# Patient Record
Sex: Female | Born: 1965 | Race: White | Hispanic: No | Smoking: Current every day smoker
Health system: Southern US, Community
[De-identification: ages and names within clinical notes are randomized; demographics above are authoritative.]

## PROBLEM LIST (undated history)

## (undated) DIAGNOSIS — E079 Disorder of thyroid, unspecified: Secondary | ICD-10-CM

## (undated) DIAGNOSIS — I10 Essential (primary) hypertension: Secondary | ICD-10-CM

## (undated) HISTORY — PX: TONSILLECTOMY: SUR1361

## (undated) HISTORY — PX: ELBOW SURGERY: SHX618

---

## 2001-09-06 ENCOUNTER — Other Ambulatory Visit: Admission: RE | Admit: 2001-09-06 | Discharge: 2001-09-06 | Payer: Self-pay | Admitting: Obstetrics and Gynecology

## 2004-01-17 ENCOUNTER — Ambulatory Visit (HOSPITAL_BASED_OUTPATIENT_CLINIC_OR_DEPARTMENT_OTHER): Admission: RE | Admit: 2004-01-17 | Discharge: 2004-01-17 | Payer: Self-pay | Admitting: Orthopedic Surgery

## 2004-01-17 ENCOUNTER — Ambulatory Visit (HOSPITAL_COMMUNITY): Admission: RE | Admit: 2004-01-17 | Discharge: 2004-01-17 | Payer: Self-pay | Admitting: Orthopedic Surgery

## 2013-04-04 ENCOUNTER — Ambulatory Visit: Payer: BC Managed Care – PPO

## 2013-04-04 ENCOUNTER — Ambulatory Visit (INDEPENDENT_AMBULATORY_CARE_PROVIDER_SITE_OTHER): Payer: BC Managed Care – PPO | Admitting: Emergency Medicine

## 2013-04-04 VITALS — BP 126/82 | HR 63 | Temp 98.0°F | Resp 16 | Ht 60.0 in | Wt 128.0 lb

## 2013-04-04 DIAGNOSIS — M549 Dorsalgia, unspecified: Secondary | ICD-10-CM

## 2013-04-04 DIAGNOSIS — M25559 Pain in unspecified hip: Secondary | ICD-10-CM

## 2013-04-04 DIAGNOSIS — S139XXA Sprain of joints and ligaments of unspecified parts of neck, initial encounter: Secondary | ICD-10-CM

## 2013-04-04 DIAGNOSIS — S20229A Contusion of unspecified back wall of thorax, initial encounter: Secondary | ICD-10-CM

## 2013-04-04 DIAGNOSIS — M25529 Pain in unspecified elbow: Secondary | ICD-10-CM

## 2013-04-04 DIAGNOSIS — Z23 Encounter for immunization: Secondary | ICD-10-CM

## 2013-04-04 DIAGNOSIS — S50319A Abrasion of unspecified elbow, initial encounter: Secondary | ICD-10-CM

## 2013-04-04 DIAGNOSIS — IMO0002 Reserved for concepts with insufficient information to code with codable children: Secondary | ICD-10-CM

## 2013-04-04 LAB — POCT UA - MICROSCOPIC ONLY
Crystals, Ur, HPF, POC: NEGATIVE
Epithelial cells, urine per micros: NEGATIVE
Mucus, UA: NEGATIVE
RBC, urine, microscopic: NEGATIVE
WBC, Ur, HPF, POC: NEGATIVE
Yeast, UA: NEGATIVE

## 2013-04-04 LAB — POCT URINALYSIS DIPSTICK
Blood, UA: NEGATIVE
Ketones, UA: NEGATIVE
Leukocytes, UA: NEGATIVE
pH, UA: 7.5

## 2013-04-04 MED ORDER — CYCLOBENZAPRINE HCL 10 MG PO TABS: 10.0000 mg | ORAL_TABLET | Freq: Three times a day (TID) | ORAL | Status: DC | PRN

## 2013-04-04 MED ORDER — NAPROXEN SODIUM 550 MG PO TABS: 550.0000 mg | ORAL_TABLET | Freq: Two times a day (BID) | ORAL | Status: AC

## 2013-04-04 NOTE — Patient Instructions (Addendum)

## 2013-04-04 NOTE — Progress Notes (Signed)
[facility] ([facility]) [address] [place] [address] [phone]  Date:  04/04/2013   Name:  Carol Ho   DOB:  06-02-1966   MRN:  [id number]  PCP:  No primary provider on file.    Chief Complaint: Head Injury and Elbow Injury   History of Present Illness:  Carol Ho is a 47 y.o. very pleasant female patient who presents with the following:  Working on a ladder trimming vines.  She fell off the ladder on to a bricked area.  She fell flat on her back and injured her elbows, low back and neck, her right "hip" and struck her head.  She had no LOC, neuro or visual symptoms.  No nausea or vomiting.  No blood in urine.  No chest pain or shortness of breath.  No peripheral neuro symptoms.  No improvement with over the counter medications or other home remedies. Denies other complaint or health concern today.   There are no active problems to display for this patient.   No past medical history on file.  No past surgical history on file.  History  Substance Use Topics  . Smoking status: Not on file  . Smokeless tobacco: Not on file  . Alcohol Use: Not on file    No family history on file.  Allergies  Allergen Reactions  . Darvon [Propoxyphene Hcl] Hives    Medication list has been reviewed and updated.  No current outpatient prescriptions on file prior to visit.   No current facility-administered medications on file prior to visit.    Review of Systems:  As per HPI, otherwise negative.    Physical Examination: Filed Vitals:   04/04/13 1135  BP: 126/82  Pulse: 63  Temp: 98 F (36.7 C)  Resp: 16   Filed Vitals:   04/04/13 1135  Height: 5' (1.524 m)  Weight: 128 lb (58.06 kg)   Body mass index is 25 kg/(m^2). Ideal Body Weight: Weight in (lb) to have BMI = 25: 127.7  As per HPI, otherwise negative.    Assessment and Plan: GEN: WDWN, NAD, Non-toxic, A & O x 3 HEENT: contusion occiput, Normocephalic. Neck supple. No masses, No  LAD. Ears and Nose: No external deformity.  PRRERLA EOMI, CN 2-12 intact.  No battle sign.   NECK tender base of spine. CV: RRR, No M/G/R. No JVD. No thrill. No extra heart sounds. PULM: CTA B, no wheezes, crackles, rhonchi. No retractions. No resp. distress. No accessory muscle use. ABD: S, NT, ND, +BS. No rebound. No HSM. Back:  Tender lumbar spine inferiorly.  No ecchymosis, deformity or step off.   PELVIS:  Tender posterior pelvis on right.  EXTR: No c/c/e  Abrasions on both elbows  Full ROM NEURO Normal gait.  PSYCH: Normally interactive. Conversant. Not depressed or anxious appearing.  Calm demeanor.   Signed,  Phillips Odor, MD   [facility] reading (PRIMARY) by  Dr. Dareen Piano.  R elbow. negative.  [facility] reading (PRIMARY) by  Dr. Dareen Piano.  L elbow.  negative.  [facility] reading (PRIMARY) by  Dr. Dareen Piano.  Cspine.  Loss lordotic curve.  [facility] reading (PRIMARY) by  Dr. Dareen Piano.  LSPINE  negative1.  [facility] reading (PRIMARY) by  Dr. Dareen Piano.  Pelvis ??chip off acetabular rim.  Results for orders placed in visit on 04/04/13  POCT UA - MICROSCOPIC ONLY      Result Value Range   WBC, Ur, HPF, POC neg     RBC, urine, microscopic neg  Bacteria, U Microscopic neg     Mucus, UA neg     Epithelial cells, urine per micros neg     Crystals, Ur, HPF, POC neg     Casts, Ur, LPF, POC neg     Yeast, UA neg    POCT URINALYSIS DIPSTICK      Result Value Range   Color, UA yellow     Clarity, UA clear     Glucose, UA neg     Bilirubin, UA neg     Ketones, UA neg     Spec Grav, UA 1.010     Blood, UA neg     pH, UA 7.5     Protein, UA trace     Urobilinogen, UA 0.2     Nitrite, UA neg     Leukocytes, UA Negative

## 2013-04-05 ENCOUNTER — Telehealth: Payer: Self-pay

## 2013-04-05 NOTE — Telephone Encounter (Signed)
[phone] patient says there are some thing that were left off her paperwork from yesterday that needs to be on there for insurance purposes

## 2013-04-06 ENCOUNTER — Telehealth: Payer: Self-pay | Admitting: Radiology

## 2013-04-06 NOTE — Telephone Encounter (Signed)
What is she referring to? She indicates she needs something stating she got a wrist brace and crutches. She was not charged for these, and there is nothing in the chart indicating she had these, will you advise if she received these here, if so we need to correct these problems.

## 2013-04-06 NOTE — Telephone Encounter (Signed)
She would not be provided with crutches AND a wrist splint.  Ask her specifically what she is referring to .....Marland KitchenMarland Kitchen

## 2013-04-06 NOTE — Telephone Encounter (Signed)
See previous note (telephone encounter)

## 2013-04-06 NOTE — Telephone Encounter (Signed)
She states you gave her the crutches and wrist splint in the office, she came in today, and was wearing the wrist splint and using the crutches, did you give these to her here?

## 2013-04-07 NOTE — Telephone Encounter (Signed)
Must have.

## 2013-04-10 NOTE — Telephone Encounter (Signed)
Can we bill for these?

## 2015-05-28 IMAGING — CR DG ELBOW COMPLETE 3+V*R*
4 series · 4 of 4 positions shown · non-contrast
Comparison: None

CLINICAL DATA: Lateral elbow pain, initial encounter

EXAM:
RIGHT ELBOW - COMPLETE 3+ VIEW

[lateral (1 of 2)]
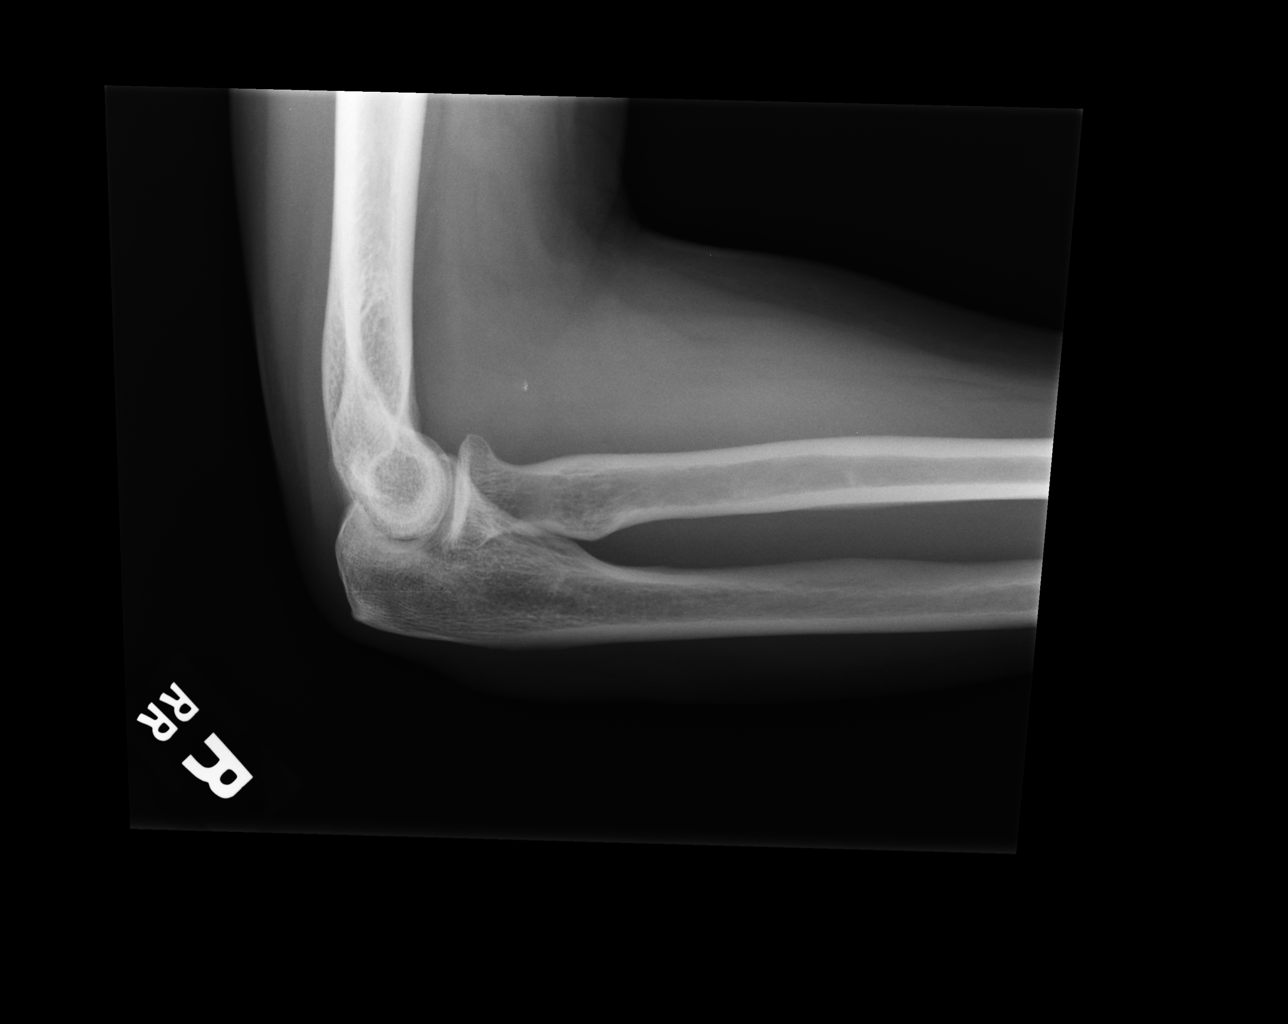

[lateral (2 of 2)]
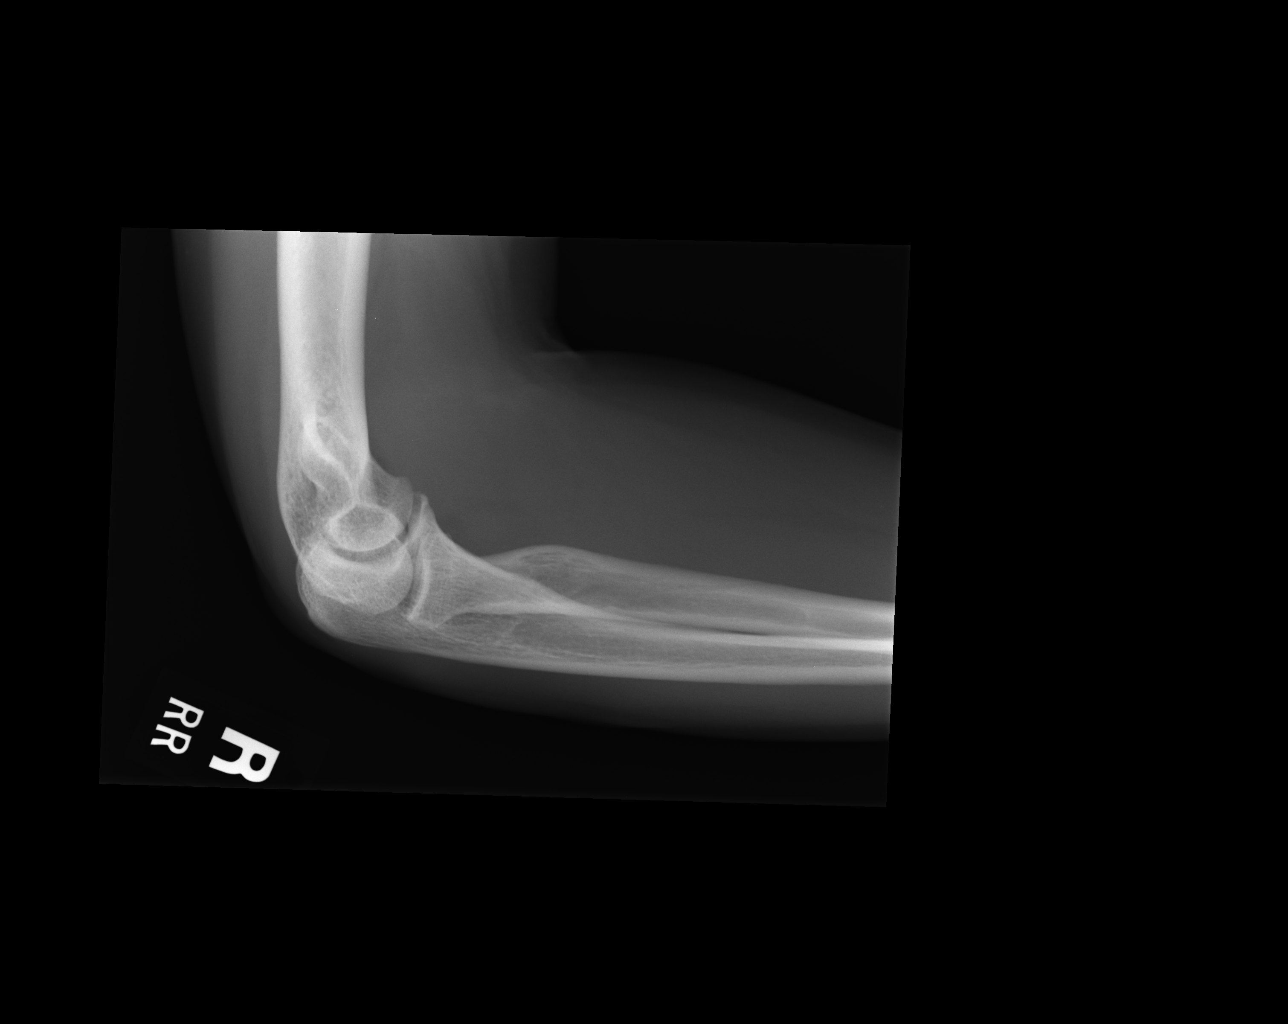

[ap obl ext rot]
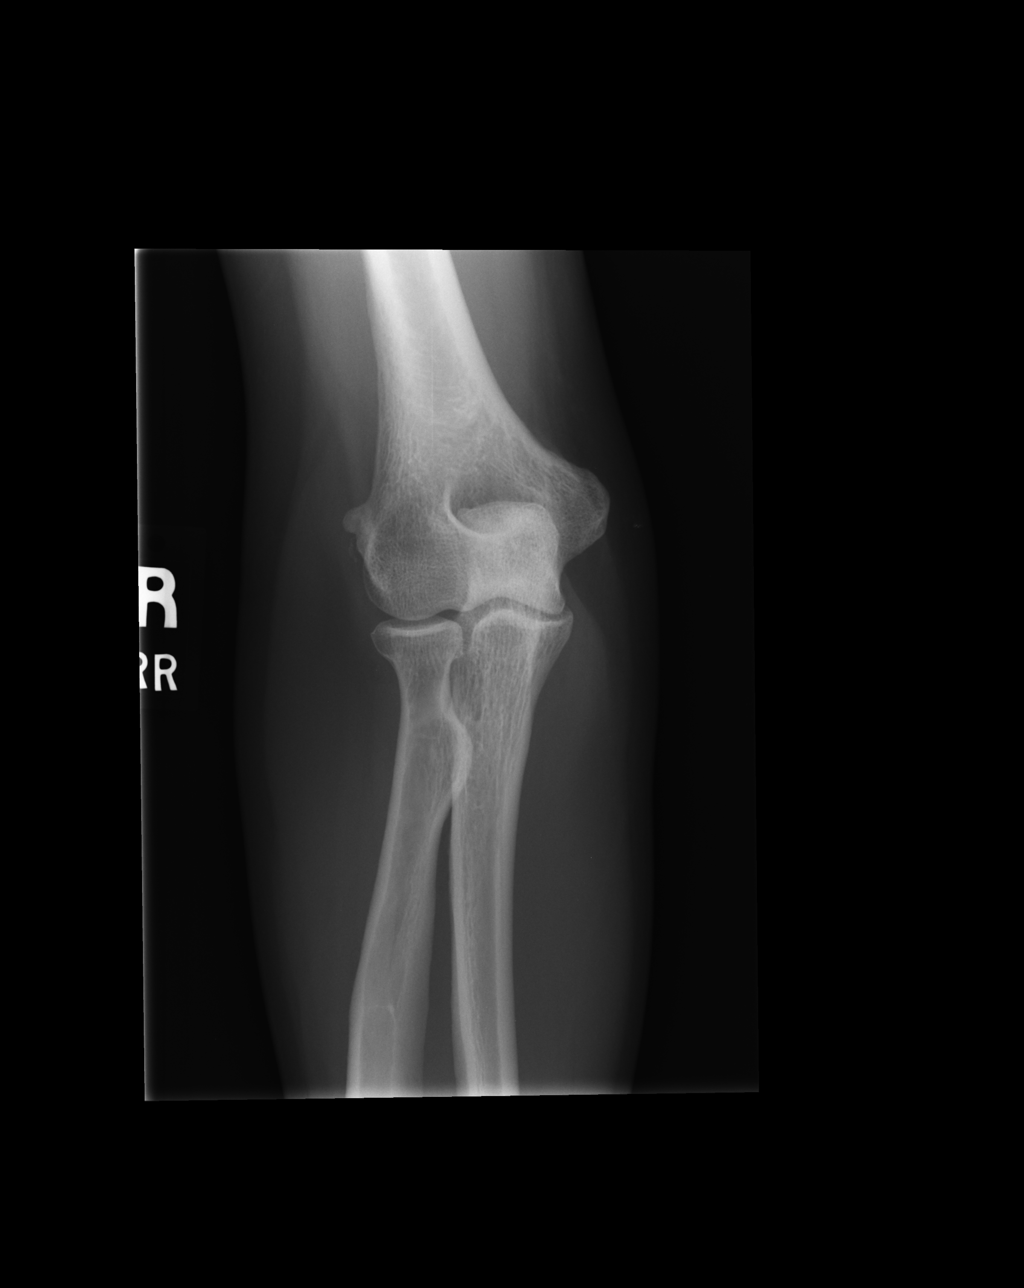

[ap obl int rot]
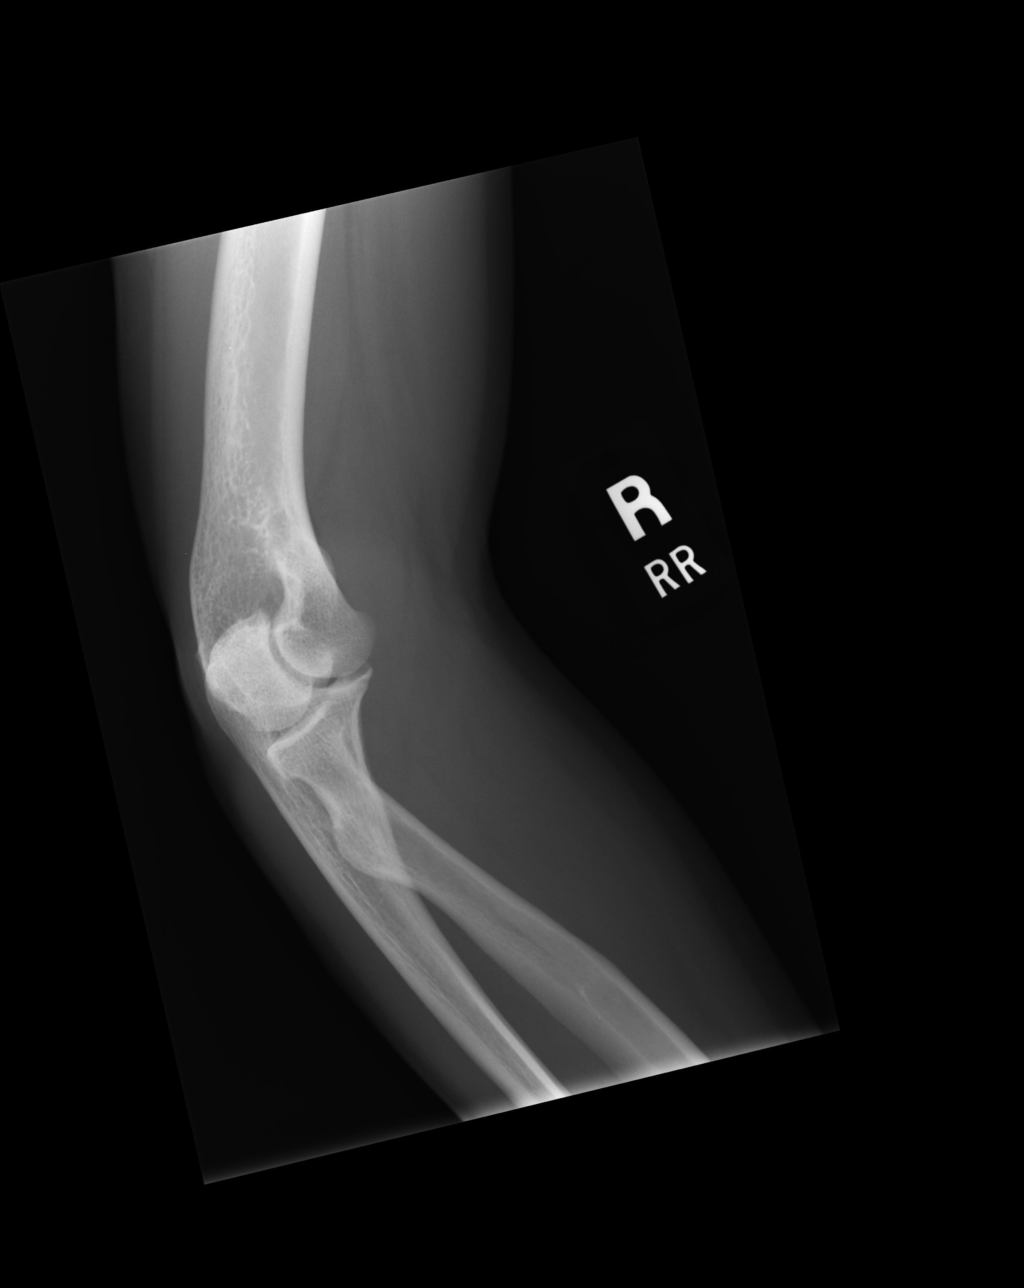

[4 of 4 positions shown; findings below may reference images not displayed]

FINDINGS: Bone mineralization normal.

Joint spaces preserved.

No joint effusion.

Calcific densities are seen at the lateral margin of the distal
humeral epicondyle, could be related to avulsion injury or chronic
lateral epicondylitis.
IMPRESSION: Calcific densities adjacent to the lateral margin of the lateral
humeral epicondyle, question due to avulsion injury or chronic
lateral epicondylitis.

## 2015-06-06 LAB — RESULTS CONSOLE HPV: CHL HPV: NEGATIVE

## 2015-06-06 LAB — HM PAP SMEAR: HM Pap smear: NORMAL

## 2015-06-06 LAB — HM MAMMOGRAPHY

## 2015-07-03 DIAGNOSIS — I10 Essential (primary) hypertension: Secondary | ICD-10-CM | POA: Insufficient documentation

## 2015-07-03 DIAGNOSIS — R251 Tremor, unspecified: Secondary | ICD-10-CM | POA: Insufficient documentation

## 2015-07-03 DIAGNOSIS — E039 Hypothyroidism, unspecified: Secondary | ICD-10-CM | POA: Insufficient documentation

## 2015-07-03 DIAGNOSIS — F411 Generalized anxiety disorder: Secondary | ICD-10-CM | POA: Insufficient documentation

## 2015-07-03 DIAGNOSIS — Z79899 Other long term (current) drug therapy: Secondary | ICD-10-CM | POA: Insufficient documentation

## 2015-07-03 DIAGNOSIS — M778 Other enthesopathies, not elsewhere classified: Secondary | ICD-10-CM | POA: Insufficient documentation

## 2015-07-03 DIAGNOSIS — R29898 Other symptoms and signs involving the musculoskeletal system: Secondary | ICD-10-CM | POA: Insufficient documentation

## 2015-07-03 DIAGNOSIS — L819 Disorder of pigmentation, unspecified: Secondary | ICD-10-CM | POA: Insufficient documentation

## 2015-07-03 DIAGNOSIS — M542 Cervicalgia: Secondary | ICD-10-CM | POA: Insufficient documentation

## 2015-07-03 DIAGNOSIS — Z789 Other specified health status: Secondary | ICD-10-CM | POA: Insufficient documentation

## 2021-01-05 DIAGNOSIS — M7551 Bursitis of right shoulder: Secondary | ICD-10-CM | POA: Insufficient documentation

## 2021-04-30 ENCOUNTER — Other Ambulatory Visit: Payer: Self-pay

## 2021-04-30 ENCOUNTER — Encounter: Payer: Self-pay | Admitting: Emergency Medicine

## 2021-04-30 ENCOUNTER — Ambulatory Visit
Admission: EM | Admit: 2021-04-30 | Discharge: 2021-04-30 | Disposition: A | Discharge status: Home / Self Care | Payer: 59 | Attending: Internal Medicine | Admitting: Internal Medicine

## 2021-04-30 DIAGNOSIS — M545 Low back pain, unspecified: Secondary | ICD-10-CM | POA: Diagnosis not present

## 2021-04-30 LAB — POCT URINALYSIS DIP (MANUAL ENTRY)
Bilirubin, UA: NEGATIVE
Glucose, UA: NEGATIVE mg/dL
Ketones, POC UA: NEGATIVE mg/dL
Nitrite, UA: NEGATIVE
Protein Ur, POC: NEGATIVE mg/dL
Spec Grav, UA: 1.01 (ref 1.010–1.025)
Urobilinogen, UA: 0.2 E.U./dL
pH, UA: 7 (ref 5.0–8.0)

## 2021-04-30 MED ORDER — CYCLOBENZAPRINE HCL 5 MG PO TABS: 5.0000 mg | ORAL_TABLET | Freq: Two times a day (BID) | ORAL | 0 refills | Status: DC | PRN

## 2021-04-30 MED ORDER — IBUPROFEN 600 MG PO TABS: 600.0000 mg | ORAL_TABLET | Freq: Four times a day (QID) | ORAL | 0 refills | Status: DC | PRN

## 2021-04-30 NOTE — Discharge Instructions (Signed)
You have been sent two medications to help alleviate pain which are ibuprofen and cyclobenzaprine which is a muscle relaxer.  Please be advised that muscle relaxer can cause drowsiness.  Your urine culture is pending.  We will call if it is positive.

## 2021-04-30 NOTE — ED Triage Notes (Addendum)
Intermittent right flank pain/lower back pain over last couple weeks. Woke up this morning with severe pain to the point of having to hold nearby items to walk. Denies urinary frequency, dysuria, hematuria, previous appendectomy, nausea, vomiting, diarrhea, fever. Post menopausal.   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 Pain relieved when pressure is applied to lower back.

## 2021-04-30 NOTE — ED Provider Notes (Signed)
[facility]    CSN: [id number] Arrival date & time: 04/30/21  1805      History   Chief Complaint Chief Complaint  Patient presents with   Flank Pain    HPI Carol Ho is a 55 y.o. female.   Patient presents with right lower back pain that has been present intermittently for the past couple weeks but has worsened since this morning.  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  Denies any history of chronic back pain.  Patient denies any urinary frequency, urinary burning, hematuria, saddle anesthesia, urinary or bowel incontinence.  Patient has not taken any medications to help alleviate pain.  Movement exacerbates pain.  Pain does not radiate.  Denies nausea, vomiting, diarrhea and is having normal bowel movements.   Flank Pain   History reviewed. No pertinent past medical history.  There are no problems to display for this patient.   Past Surgical History:  Procedure Laterality Date   ELBOW SURGERY     TONSILLECTOMY      OB History   No obstetric history on file.      Home Medications    Prior to Admission medications   Medication Sig Start Date End Date Taking? Authorizing Provider  cyclobenzaprine (FLEXERIL) 5 MG tablet Take 1 tablet (5 mg total) by mouth 2 (two) times daily as needed for muscle spasms. 04/30/21  Yes Lynnie Koehler, Rolly Salter E, FNP  ibuprofen (ADVIL) 600 MG tablet Take 1 tablet (600 mg total) by mouth every 6 (six) hours as needed for mild pain. 04/30/21  Yes Sharlotte Baka, Rolly Salter E, FNP  hydrochlorothiazide (HYDRODIURIL) 25 MG tablet Take 25 mg by mouth daily.    [provider]  levothyroxine (SYNTHROID, LEVOTHROID) 100 MCG tablet Take 100 mcg by mouth daily before breakfast.    [provider]    Family History History reviewed. No pertinent family history.  Social History Social History   Tobacco Use   Smoking status: Every Day    Types: Cigarettes    Smokeless tobacco: Never     Allergies   Darvon [propoxyphene hcl]   Review of Systems Review of Systems Per HPI  Physical Exam Triage Vital Signs ED Triage Vitals  Enc Vitals Group     BP 04/30/21 1822 (!) 165/91     Pulse Rate 04/30/21 1822 75     Resp 04/30/21 1822 16     Temp 04/30/21 1822 98.9 F (37.2 C)     Temp Source 04/30/21 1822 Oral     SpO2 04/30/21 1822 99 %     Weight --      Height --      Head Circumference --      Peak Flow --      Pain Score 04/30/21 1823 7     Pain Loc --      Pain Edu? --      Excl. in GC? --    No data found.  Updated Vital Signs BP (!) 165/91 (BP Location: Right Arm)   Pulse 75   Temp 98.9 F (37.2 C) (Oral)   Resp 16   SpO2 99%   Visual Acuity Right Eye Distance:   Left Eye Distance:   Bilateral Distance:    Right Eye Near:   Left Eye Near:    Bilateral Near:     Physical Exam Constitutional:      General: She is not in acute distress.  Appearance: Normal appearance. She is not toxic-appearing or diaphoretic.  HENT:     Head: Normocephalic and atraumatic.  Eyes:     Extraocular Movements: Extraocular movements intact.     Conjunctiva/sclera: Conjunctivae normal.  Cardiovascular:     Rate and Rhythm: Normal rate and regular rhythm.     Pulses: Normal pulses.     Heart sounds: Normal heart sounds.  Pulmonary:     Effort: Pulmonary effort is normal. No respiratory distress.     Breath sounds: Normal breath sounds.  Abdominal:     General: Bowel sounds are normal. There is no distension.     Palpations: Abdomen is soft.     Tenderness: There is no abdominal tenderness.  Musculoskeletal:     Cervical back: Normal.     Thoracic back: Normal.     Lumbar back: Tenderness present. No swelling, edema or bony tenderness. Normal range of motion.       Back:     Comments: Tenderness to palpation to area on diagram.  No step-off or direct spinal tenderness.  Skin:    General: Skin is warm and dry.   Neurological:     General: No focal deficit present.     Mental Status: She is alert and oriented to person, place, and time. Mental status is at baseline.     Deep Tendon Reflexes: Reflexes are normal and symmetric.  Psychiatric:        Mood and Affect: Mood normal.        Behavior: Behavior normal.        Thought Content: Thought content normal.        Judgment: Judgment normal.     UC Treatments / Results  Labs (all labs ordered are listed, but only abnormal results are displayed) Labs Reviewed  POCT URINALYSIS DIP (MANUAL ENTRY) - Abnormal; Notable for the following components:      Result Value   Blood, UA trace-intact (*)    Leukocytes, UA Trace (*)    All other components within normal limits  URINE CULTURE    EKG   Radiology No results found.  Procedures Procedures (including critical care time)  Medications Ordered in UC Medications - No data to display  Initial Impression / Assessment and Plan / UC Course  I have reviewed the triage vital signs and the nursing notes.  Pertinent labs & imaging results that were available during my care of the patient were reviewed by me and considered in my medical decision making (see chart for details).     Differential diagnoses include back strain, kidney stone, urinary tract infection.  Urinalysis was not definitive for urinary tract infection.  Urine culture is pending.  Urinalysis does show small amount of red blood cells which could be indicative of kidney stone, although there is low suspicion.  Advised patient that it is possible.  Physical exam is most consistent with musculoskeletal pain.  Will treat with muscle relaxer and NSAIDs.  Patient to alternate ice and heat to affected area as well.  Advised patient that muscle relaxer can cause drowsiness.  No red flags seen on exam.  Patient advised to go to the hospital if pain significantly worsens.  Discussed strict return precautions.  Patient was agreeable plan and  voiced understanding. Final Clinical Impressions(s) / UC Diagnoses   Final diagnoses:  Acute right-sided low back pain without sciatica     Discharge Instructions      You have been sent two medications to help alleviate pain  which are ibuprofen and cyclobenzaprine which is a muscle relaxer.  Please be advised that muscle relaxer can cause drowsiness.  Your urine culture is pending.  We will call if it is positive.     ED Prescriptions     Medication Sig Dispense Auth. Provider   cyclobenzaprine (FLEXERIL) 5 MG tablet Take 1 tablet (5 mg total) by mouth 2 (two) times daily as needed for muscle spasms. 15 tablet [place], [place], [place]   ibuprofen (ADVIL) 600 MG tablet Take 1 tablet (600 mg total) by mouth every 6 (six) hours as needed for mild pain. 30 tablet [place], [place], [place]      I have reviewed the PDMP during this encounter.   Gustavus Bryant, [place] 04/30/21 1904

## 2021-05-03 LAB — URINE CULTURE: Culture: >=100000 — AB

## 2021-05-04 ENCOUNTER — Telehealth (HOSPITAL_COMMUNITY): Payer: Self-pay | Admitting: Emergency Medicine

## 2021-05-04 MED ORDER — NITROFURANTOIN MONOHYD MACRO 100 MG PO CAPS: 100.0000 mg | ORAL_CAPSULE | Freq: Two times a day (BID) | ORAL | 0 refills | Status: DC

## 2021-07-29 ENCOUNTER — Ambulatory Visit: Payer: Self-pay | Admitting: Orthopedic Surgery

## 2021-08-07 ENCOUNTER — Ambulatory Visit: Payer: Self-pay | Admitting: Orthopedic Surgery

## 2021-08-11 ENCOUNTER — Ambulatory Visit
Admission: EM | Admit: 2021-08-11 | Discharge: 2021-08-11 | Disposition: A | Discharge status: Other Acute Inpatient Hospital | Payer: Self-pay | Attending: Internal Medicine | Admitting: Internal Medicine

## 2021-08-11 ENCOUNTER — Emergency Department (HOSPITAL_BASED_OUTPATIENT_CLINIC_OR_DEPARTMENT_OTHER)
Admission: EM | Admit: 2021-08-11 | Discharge: 2021-08-11 | Disposition: A | Discharge status: Home / Self Care | Payer: Self-pay | Attending: Emergency Medicine | Admitting: Emergency Medicine

## 2021-08-11 ENCOUNTER — Encounter (HOSPITAL_BASED_OUTPATIENT_CLINIC_OR_DEPARTMENT_OTHER): Payer: Self-pay | Admitting: *Deleted

## 2021-08-11 ENCOUNTER — Encounter: Payer: Self-pay | Admitting: Emergency Medicine

## 2021-08-11 ENCOUNTER — Other Ambulatory Visit: Payer: Self-pay

## 2021-08-11 DIAGNOSIS — H5509 Other forms of nystagmus: Secondary | ICD-10-CM | POA: Insufficient documentation

## 2021-08-11 DIAGNOSIS — I160 Hypertensive urgency: Secondary | ICD-10-CM

## 2021-08-11 DIAGNOSIS — R42 Dizziness and giddiness: Secondary | ICD-10-CM | POA: Insufficient documentation

## 2021-08-11 DIAGNOSIS — E876 Hypokalemia: Secondary | ICD-10-CM | POA: Insufficient documentation

## 2021-08-11 HISTORY — DX: Essential (primary) hypertension: I10

## 2021-08-11 HISTORY — DX: Disorder of thyroid, unspecified: E07.9

## 2021-08-11 LAB — CBC WITH DIFFERENTIAL/PLATELET
Abs Immature Granulocytes: 0.02 10*3/uL (ref 0.00–0.07)
Basophils Absolute: 0.1 10*3/uL (ref 0.0–0.1)
Basophils Relative: 1 %
Eosinophils Absolute: 0.2 10*3/uL (ref 0.0–0.5)
Eosinophils Relative: 3 %
HCT: 44.9 % (ref 36.0–46.0)
Hemoglobin: 15.9 g/dL — ABNORMAL HIGH (ref 12.0–15.0)
Immature Granulocytes: 0 %
Lymphocytes Relative: 41 %
Lymphs Abs: 3.1 10*3/uL (ref 0.7–4.0)
MCH: 34.5 pg — ABNORMAL HIGH (ref 26.0–34.0)
MCHC: 35.4 g/dL (ref 30.0–36.0)
MCV: 97.4 fL (ref 80.0–100.0)
Monocytes Absolute: 0.4 10*3/uL (ref 0.1–1.0)
Monocytes Relative: 6 %
Neutro Abs: 3.7 10*3/uL (ref 1.7–7.7)
Neutrophils Relative %: 49 %
Platelets: 293 10*3/uL (ref 150–400)
RBC: 4.61 MIL/uL (ref 3.87–5.11)
RDW: 12.5 % (ref 11.5–15.5)
WBC: 7.5 10*3/uL (ref 4.0–10.5)
nRBC: 0 % (ref 0.0–0.2)

## 2021-08-11 LAB — BASIC METABOLIC PANEL
Anion gap: 11 (ref 5–15)
BUN: 14 mg/dL (ref 6–20)
CO2: 27 mmol/L (ref 22–32)
Calcium: 9.4 mg/dL (ref 8.9–10.3)
Chloride: 99 mmol/L (ref 98–111)
Creatinine, Ser: 0.7 mg/dL (ref 0.44–1.00)
GFR, Estimated: >60 mL/min (ref >60)
Glucose, Bld: 88 mg/dL (ref 70–99)
Potassium: 3.1 mmol/L — ABNORMAL LOW (ref 3.5–5.1)
Sodium: 137 mmol/L (ref 135–145)

## 2021-08-11 LAB — URINALYSIS, ROUTINE W REFLEX MICROSCOPIC
Bilirubin Urine: NEGATIVE
Glucose, UA: NEGATIVE mg/dL
Hgb urine dipstick: NEGATIVE
Ketones, ur: NEGATIVE mg/dL
Leukocytes,Ua: NEGATIVE
Nitrite: NEGATIVE
Protein, ur: NEGATIVE mg/dL
Specific Gravity, Urine: 1.015 (ref 1.005–1.030)
pH: 5.5 (ref 5.0–8.0)

## 2021-08-11 MED ORDER — POTASSIUM CHLORIDE CRYS ER 20 MEQ PO TBCR
40.0000 meq | EXTENDED_RELEASE_TABLET | Freq: Once | ORAL | Status: AC
  Administered 2021-08-11: 40 meq via ORAL
  Filled 2021-08-11: qty 2

## 2021-08-11 MED ORDER — SODIUM CHLORIDE 0.9 % IV BOLUS
500.0000 mL | Freq: Once | INTRAVENOUS | Status: AC
  Administered 2021-08-11: 500 mL via INTRAVENOUS

## 2021-08-11 NOTE — Discharge Instructions (Signed)
Please go to the emergency department as soon as you leave urgent care for further evaluation and management. ?

## 2021-08-11 NOTE — Discharge Instructions (Signed)
You were seen in the emergency department for dizziness and elevated blood pressure.  You had lab work and EKG that did not show any significant findings other than your potassium was low.  Please continue your regular medications and follow-up with your primary care doctor.  Low-salt diet.  Return to the emergency department if any worsening or concerning symptoms.

## 2021-08-11 NOTE — ED Provider Notes (Signed)
[facility] EMERGENCY DEPARTMENT Provider Note   CSN: [id number] Arrival date & time: 08/11/21  1657     History  Chief Complaint  Patient presents with   Dizziness    Carol Ho is a 56 y.o. female.  She said she is felt lightheaded for a couple of days.  Today was squatting down to do something at work and when she stood up she got very dizzy and unsteady.  It is resolved.  She checked her blood pressure and found it to be high.  She normally is on hydrochlorothiazide for blood pressure and its usually been steady.  No headache blurry vision double vision nausea vomiting chest pain shortness of breath.  She went to urgent care and found her blood pressure to be high.  They referred her here for further evaluation.  She said she is feeling better and wondering when she can be discharged because she needs to go get something to eat.  No numbness or weakness.  No unsteady gait.  The history is provided by the patient.  Dizziness Quality:  Lightheadedness and room spinning Severity:  Moderate Onset quality:  Sudden Duration: Seconds. Progression:  Resolved Chronicity:  New Context: bending over and standing up   Relieved by:  None tried Worsened by:  Nothing Ineffective treatments:  None tried Associated symptoms: no chest pain, no diarrhea, no headaches, no nausea, no palpitations, no shortness of breath, no syncope, no vision changes, no vomiting and no weakness   Risk factors: no new medications       Home Medications Prior to Admission medications   Medication Sig Start Date End Date Taking? Authorizing Provider  hydrochlorothiazide (HYDRODIURIL) 25 MG tablet Take 25 mg by mouth daily.   Yes [provider]  levothyroxine (SYNTHROID, LEVOTHROID) 100 MCG tablet Take 100 mcg by mouth daily before breakfast.   Yes [provider]  cyclobenzaprine (FLEXERIL) 5 MG tablet Take 1 tablet (5 mg total) by mouth 2 (two) times daily as needed for muscle  spasms. 04/30/21   Gustavus Bryant, FNP  ibuprofen (ADVIL) 600 MG tablet Take 1 tablet (600 mg total) by mouth every 6 (six) hours as needed for mild pain. 04/30/21   Gustavus Bryant, FNP  nitrofurantoin, macrocrystal-monohydrate, (MACROBID) 100 MG capsule Take 1 capsule (100 mg total) by mouth 2 (two) times daily. 05/04/21   Lamptey, Britta Mccreedy, MD      Allergies    Darvon [propoxyphene hcl]    Review of Systems   Review of Systems  Constitutional:  Negative for fever.  HENT:  Negative for sore throat.   Eyes:  Negative for visual disturbance.  Respiratory:  Negative for shortness of breath.   Cardiovascular:  Negative for chest pain, palpitations and syncope.  Gastrointestinal:  Negative for abdominal pain, diarrhea, nausea and vomiting.  Genitourinary:  Negative for dysuria.  Musculoskeletal:  Negative for neck pain.  Skin:  Negative for rash.  Neurological:  Positive for dizziness. Negative for weakness and headaches.   Physical Exam Updated Vital Signs BP (!) 191/81 (BP Location: Right Arm)    Pulse (!) 53    Temp 98 F (36.7 C) (Oral)    Resp 18    Ht 5\' 1"  (1.549 m)    Wt 28.8 kg    SpO2 100%    BMI 12.00 kg/m  Physical Exam Vitals and nursing note reviewed.  Constitutional:      General: She is not in acute distress.    Appearance: Normal  appearance. She is well-developed.  HENT:     Head: Normocephalic and atraumatic.  Eyes:     Extraocular Movements: Extraocular movements intact.     Conjunctiva/sclera: Conjunctivae normal.     Pupils: Pupils are equal, round, and reactive to light.     Comments: She had 1 beat of horizontal nystagmus  Cardiovascular:     Rate and Rhythm: Normal rate and regular rhythm.     Heart sounds: No murmur heard. Pulmonary:     Effort: Pulmonary effort is normal. No respiratory distress.     Breath sounds: Normal breath sounds.  Abdominal:     Palpations: Abdomen is soft.     Tenderness: There is no abdominal tenderness.  Musculoskeletal:         General: No swelling.     Cervical back: Neck supple.  Skin:    General: Skin is warm and dry.     Capillary Refill: Capillary refill takes less than 2 seconds.  Neurological:     General: No focal deficit present.     Mental Status: She is alert and oriented to person, place, and time.     Cranial Nerves: No cranial nerve deficit.     Sensory: No sensory deficit.     Motor: No weakness.     Gait: Gait normal.    ED Results / Procedures / Treatments   Labs (all labs ordered are listed, but only abnormal results are displayed) Labs Reviewed  BASIC METABOLIC PANEL - Abnormal; Notable for the following components:      Result Value   Potassium 3.1 (*)    All other components within normal limits  CBC WITH DIFFERENTIAL/PLATELET - Abnormal; Notable for the following components:   Hemoglobin 15.9 (*)    MCH 34.5 (*)    All other components within normal limits  URINALYSIS, ROUTINE W REFLEX MICROSCOPIC    EKG None  Radiology No results found.  Procedures Procedures    Medications Ordered in ED Medications  sodium chloride 0.9 % bolus 500 mL (has no administration in time range)    ED Course/ Medical Decision Making/ A&P Clinical Course as of 08/12/21 0929  Tue Aug 11, 2021  2114 Reviewed results of work-up with her.  Potassium was repleted.  Recommended close follow-up with PCP and low-salt diet.  Return instructions discussed [MB]  2129 Patient was very tearful as I went in to talk to her.  The nurse states she has been under a lot of stress. [MB]    Clinical Course User Index [MB] Hayden Rasmussen, MD                           Medical Decision Making Amount and/or Complexity of Data Reviewed Labs: ordered.  Risk Prescription drug management.  This patient complains of dizziness elevated blood pressure; this involves an extensive number of treatment Options and is a complaint that carries with it a high risk of complications and Morbidity. The  differential includes vertigo, stroke, bleed, dehydration, AKI, metabolic derangement, arrhythmia, stress  I ordered, reviewed and interpreted labs, which included CBC with normal white count normal hemoglobin, chemistries with low potassium, urinalysis without signs of infection I ordered medication IV fluids oral potassium Previous records obtained and reviewed in epic no recent admissions  After the interventions stated above, I reevaluated the patient and found patient to be neurologically intact and hemodynamically stable although blood pressure remains elevated.  Patient under significant stressors.  No indications for admission or further imaging at this time.  We will hold off on adding additional blood pressure management to patient as this seems to be a temporary issue.  Recommended close follow-up with her treating provider.  Return instructions discussed          Final Clinical Impression(s) / ED Diagnoses Final diagnoses:  Dizziness  Hypokalemia    Rx / DC Orders ED Discharge Orders     None         Hayden Rasmussen, MD 08/12/21 [phone]

## 2021-08-11 NOTE — ED Notes (Signed)
Pt hooked up to the monitor with the five lead

## 2021-08-11 NOTE — ED Notes (Signed)
Pt requested ginger ale, nabs, trail mix and kind bar. Pt given the same

## 2021-08-11 NOTE — ED Notes (Signed)
RN Misty did orthostatic vitals

## 2021-08-11 NOTE — ED Provider Notes (Signed)
[facility]    CSN: [id number] Arrival date & time: 08/11/21  1439      History   Chief Complaint Chief Complaint  Patient presents with   Dizziness    HPI Carol Ho is a 56 y.o. female.   Patient presents today after having a dizziness episode.  Patient reports that she was working outside and was bent over, and when she stood up she became very dizzy.  She reports that she is still currently dizzy but it has improved.  Denies any associated headache, blurred vision, nausea, vomiting, chest pain, shortness of breath.  Patient is concerned for possible blood pressure issues.  She reports she currently takes hydrochlorothiazide and has been for many years.  She does not take her blood pressure at home.  Last saw PCP approximately 1 year ago and blood pressure was 120 systolic.   Dizziness  History reviewed. No pertinent past medical history.  There are no problems to display for this patient.   Past Surgical History:  Procedure Laterality Date   ELBOW SURGERY     TONSILLECTOMY      OB History   No obstetric history on file.      Home Medications    Prior to Admission medications   Medication Sig Start Date End Date Taking? Authorizing Provider  hydrochlorothiazide (HYDRODIURIL) 25 MG tablet Take 25 mg by mouth daily.   Yes [provider]  levothyroxine (SYNTHROID, LEVOTHROID) 100 MCG tablet Take 100 mcg by mouth daily before breakfast.   Yes [provider]  cyclobenzaprine (FLEXERIL) 5 MG tablet Take 1 tablet (5 mg total) by mouth 2 (two) times daily as needed for muscle spasms. 04/30/21   Gustavus Bryant, FNP  ibuprofen (ADVIL) 600 MG tablet Take 1 tablet (600 mg total) by mouth every 6 (six) hours as needed for mild pain. 04/30/21   Gustavus Bryant, FNP  nitrofurantoin, macrocrystal-monohydrate, (MACROBID) 100 MG capsule Take 1 capsule (100 mg total) by mouth 2 (two) times daily. 05/04/21   LampteyBritta Mccreedy, MD    Family  History Family History  Problem Relation Age of Onset   Healthy Father    Healthy Brother     Social History Social History   Tobacco Use   Smoking status: Every Day    Types: Cigarettes   Smokeless tobacco: Never     Allergies   Darvon [propoxyphene hcl]   Review of Systems Review of Systems Per HPI  Physical Exam Triage Vital Signs ED Triage Vitals [08/11/21 1544]  Enc Vitals Group     BP (!) 182/93     Pulse Rate (!) 54     Resp 18     Temp 98.5 F (36.9 C)     Temp Source Oral     SpO2 98 %     Weight 140 lb (63.5 kg)     Height 5\' 1"  (1.549 m)     Head Circumference      Peak Flow      Pain Score 0     Pain Loc      Pain Edu?      Excl. in GC?    No data found.  Updated Vital Signs BP (!) 182/93 (BP Location: Left Arm)    Pulse (!) 54    Temp 98.5 F (36.9 C) (Oral)    Resp 18    Ht 5\' 1"  (1.549 m)    Wt 140 lb (63.5 kg)    SpO2 98%  BMI 26.45 kg/m   Visual Acuity Right Eye Distance:   Left Eye Distance:   Bilateral Distance:    Right Eye Near:   Left Eye Near:    Bilateral Near:     Physical Exam Constitutional:      General: She is not in acute distress.    Appearance: Normal appearance. She is not toxic-appearing or diaphoretic.  HENT:     Head: Normocephalic and atraumatic.     Right Ear: Tympanic membrane and ear canal normal.     Left Ear: Tympanic membrane and ear canal normal.  Eyes:     Extraocular Movements: Extraocular movements intact.     Conjunctiva/sclera: Conjunctivae normal.     Pupils: Pupils are equal, round, and reactive to light.  Cardiovascular:     Rate and Rhythm: Normal rate and regular rhythm.     Pulses: Normal pulses.     Heart sounds: Normal heart sounds.  Pulmonary:     Effort: Pulmonary effort is normal. No respiratory distress.     Breath sounds: Normal breath sounds.  Neurological:     General: No focal deficit present.     Mental Status: She is alert and oriented to person, place, and time.  Mental status is at baseline.     Cranial Nerves: Cranial nerves 2-12 are intact.     Sensory: Sensation is intact.     Motor: Motor function is intact.     Coordination: Coordination is intact.     Gait: Gait is intact.  Psychiatric:        Mood and Affect: Mood normal.        Behavior: Behavior normal.        Thought Content: Thought content normal.        Judgment: Judgment normal.     UC Treatments / Results  Labs (all labs ordered are listed, but only abnormal results are displayed) Labs Reviewed - No data to display  EKG   Radiology No results found.  Procedures Procedures (including critical care time)  Medications Ordered in UC Medications - No data to display  Initial Impression / Assessment and Plan / UC Course  I have reviewed the triage vital signs and the nursing notes.  Pertinent labs & imaging results that were available during my care of the patient were reviewed by me and considered in my medical decision making (see chart for details).     Patient exhibiting signs of hypertensive urgency.  Retake of blood pressure was also 180 systolic.  Patient is still currently dizzy.  Physical exam and neuro exam is fairly normal.  EKG showing sinus bradycardia.  Advised patient that it would be best for her to go to the hospital for further evaluation and management to get control of blood pressure.  Patient was agreeable with plan.  Vital signs fairly stable at discharge.  Agree with patient self transport to the hospital. Final Clinical Impressions(s) / UC Diagnoses   Final diagnoses:  Hypertensive urgency  Dizziness and giddiness     Discharge Instructions      Please go to the emergency department as soon as you leave urgent care for further evaluation and management.    ED Prescriptions   None    PDMP not reviewed this encounter.   Gustavus Bryant, [place] 08/11/21 1630

## 2021-08-11 NOTE — ED Triage Notes (Signed)
Patient states that she was working outside and when she bent over and stood up today, she became very dizzy.  Patient eats well and hydrates well.  States she feels very "odd".  Patient does have a history of high blood pressure.

## 2021-08-11 NOTE — ED Triage Notes (Signed)
Dizziness x 2 days. Worse when she bends over. No hx of vertigo.

## 2022-01-21 ENCOUNTER — Ambulatory Visit (INDEPENDENT_AMBULATORY_CARE_PROVIDER_SITE_OTHER): Payer: 59 | Admitting: Obstetrics and Gynecology

## 2022-01-21 ENCOUNTER — Other Ambulatory Visit (HOSPITAL_COMMUNITY)
Admission: RE | Admit: 2022-01-21 | Discharge: 2022-01-21 | Disposition: A | Discharge status: Home / Self Care | Payer: 59 | Source: Ambulatory Visit | Attending: Obstetrics and Gynecology | Admitting: Obstetrics and Gynecology

## 2022-01-21 ENCOUNTER — Encounter: Payer: Self-pay | Admitting: Obstetrics and Gynecology

## 2022-01-21 VITALS — BP 130/82 | HR 88 | Ht 61.0 in | Wt 148.0 lb

## 2022-01-21 DIAGNOSIS — Z124 Encounter for screening for malignant neoplasm of cervix: Secondary | ICD-10-CM | POA: Insufficient documentation

## 2022-01-21 DIAGNOSIS — R400 Somnolence: Secondary | ICD-10-CM | POA: Insufficient documentation

## 2022-01-21 DIAGNOSIS — Z01419 Encounter for gynecological examination (general) (routine) without abnormal findings: Secondary | ICD-10-CM

## 2022-01-21 DIAGNOSIS — N644 Mastodynia: Secondary | ICD-10-CM | POA: Diagnosis not present

## 2022-01-21 DIAGNOSIS — Z1211 Encounter for screening for malignant neoplasm of colon: Secondary | ICD-10-CM

## 2022-01-21 DIAGNOSIS — G4733 Obstructive sleep apnea (adult) (pediatric): Secondary | ICD-10-CM | POA: Insufficient documentation

## 2022-01-21 NOTE — Patient Instructions (Addendum)
EXERCISE   We recommended that you start or continue a regular exercise program for good health. Physical activity is anything that gets your body moving, some is better than none. The CDC recommends 150 minutes per week of Moderate-Intensity Aerobic Activity and 2 or more days of Muscle Strengthening Activity.  Benefits of exercise are limitless: helps weight loss/weight maintenance, improves mood and energy, helps with depression and anxiety, improves sleep, tones and strengthens muscles, improves balance, improves bone density, protects from chronic conditions such as heart disease, high blood pressure and diabetes and so much more. To learn more visit: https://www.cdc.gov/physicalactivity/index.html  DIET: Good nutrition starts with a healthy diet of fruits, vegetables, whole grains, and lean protein sources. Drink plenty of water for hydration. Minimize empty calories, sodium, sweets. For more information about dietary recommendations visit: https://health.gov/our-work/nutrition-physical-activity/dietary-guidelines and https://www.myplate.gov/  ALCOHOL:  Women should limit their alcohol intake to no more than 7 drinks/beers/glasses of wine (combined, not each!) per week. Moderation of alcohol intake to this level decreases your risk of breast cancer and liver damage.  If you are concerned that you may have a problem, or your friends have told you they are concerned about your drinking, there are many resources to help. A well-known program that is free, effective, and available to all people all over the nation is Alcoholics Anonymous.  Check out this site to learn more: https://www.aa.org/   CALCIUM AND VITAMIN D:  Adequate intake of calcium and Vitamin D are recommended for bone health.  You should be getting between 1000-1200 mg of calcium and 800 units of Vitamin D daily between diet and supplements  PAP SMEARS:  Pap smears, to check for cervical cancer or precancers,  have traditionally been  done yearly, scientific advances have shown that most women can have pap smears less often.  However, every woman still should have a physical exam from her gynecologist every year. It will include a breast check, inspection of the vulva and vagina to check for abnormal growths or skin changes, a visual exam of the cervix, and then an exam to evaluate the size and shape of the uterus and ovaries. We will also provide age appropriate advice regarding health maintenance, like when you should have certain vaccines, screening for sexually transmitted diseases, bone density testing, colonoscopy, mammograms, etc.   MAMMOGRAMS:  All women over 40 years old should have a routine mammogram.   COLON CANCER SCREENING: Now recommend starting at age 45. At this time colonoscopy is not covered for routine screening until 50. There are take home tests that can be done between 45-49.   COLONOSCOPY:  Colonoscopy to screen for colon cancer is recommended for all women at age 50.  We know, you hate the idea of the prep.  We agree, BUT, having colon cancer and not knowing it is worse!!  Colon cancer so often starts as a polyp that can be seen and removed at colonscopy, which can quite literally save your life!  And if your first colonoscopy is normal and you have no family history of colon cancer, most women don't have to have it again for 10 years.  Once every ten years, you can do something that may end up saving your life, right?  We will be happy to help you get it scheduled when you are ready.  Be sure to check your insurance coverage so you understand how much it will cost.  It may be covered as a preventative service at no cost, but you should check   your particular policy.      Breast Self-Awareness Breast self-awareness means being familiar with how your breasts look and feel. It involves checking your breasts regularly and reporting any changes to your health care provider. Practicing breast self-awareness is  important. A change in your breasts can be a sign of a serious medical problem. Being familiar with how your breasts look and feel allows you to find any problems early, when treatment is more likely to be successful. All women should practice breast self-awareness, including women who have had breast implants. How to do a breast self-exam One way to learn what is normal for your breasts and whether your breasts are changing is to do a breast self-exam. To do a breast self-exam: Look for Changes  Remove all the clothing above your waist. Stand in front of a mirror in a room with good lighting. Put your hands on your hips. Push your hands firmly downward. Compare your breasts in the mirror. Look for differences between them (asymmetry), such as: Differences in shape. Differences in size. Puckers, dips, and bumps in one breast and not the other. Look at each breast for changes in your skin, such as: Redness. Scaly areas. Look for changes in your nipples, such as: Discharge. Bleeding. Dimpling. Redness. A change in position. Feel for Changes Carefully feel your breasts for lumps and changes. It is best to do this while lying on your back on the floor and again while sitting or standing in the shower or tub with soapy water on your skin. Feel each breast in the following way: Place the arm on the side of the breast you are examining above your head. Feel your breast with the other hand. Start in the nipple area and make  inch (2 cm) overlapping circles to feel your breast. Use the pads of your three middle fingers to do this. Apply light pressure, then medium pressure, then firm pressure. The light pressure will allow you to feel the tissue closest to the skin. The medium pressure will allow you to feel the tissue that is a little deeper. The firm pressure will allow you to feel the tissue close to the ribs. Continue the overlapping circles, moving downward over the breast until you feel your  ribs below your breast. Move one finger-width toward the center of the body. Continue to use the  inch (2 cm) overlapping circles to feel your breast as you move slowly up toward your collarbone. Continue the up and down exam using all three pressures until you reach your armpit.  Write Down What You Find  Write down what is normal for each breast and any changes that you find. Keep a written record with breast changes or normal findings for each breast. By writing this information down, you do not need to depend only on memory for size, tenderness, or location. Write down where you are in your menstrual cycle, if you are still menstruating. If you are having trouble noticing differences in your breasts, do not get discouraged. With time you will become more familiar with the variations in your breasts and more comfortable with the exam. How often should I examine my breasts? Examine your breasts every month. If you are breastfeeding, the best time to examine your breasts is after a feeding or after using a breast pump. If you menstruate, the best time to examine your breasts is 5-7 days after your period is over. During your period, your breasts are lumpier, and it may be more   difficult to notice changes. When should I see my health care provider? See your health care provider if you notice: A change in shape or size of your breasts or nipples. A change in the skin of your breast or nipples, such as a reddened or scaly area. Unusual discharge from your nipples. A lump or thick area that was not there before. Pain in your breasts. Anything that concerns you. Breast Tenderness Breast tenderness is a common problem for women of all ages, but may also occur in men. Breast tenderness may range from mild discomfort to severe pain. In women, the pain usually comes and goes with the menstrual cycle, but it can also be constant. Breast tenderness has many possible causes, including hormone changes,  infections, and taking certain medicines. You may have tests, such as a mammogram or an ultrasound, to check for any unusual findings. Having breast tenderness usually does not mean that you have breast cancer. Follow these instructions at home: Managing pain and discomfort  If directed, put ice to the painful area. To do this: Put ice in a plastic bag. Place a towel between your skin and the bag. Leave the ice on for 20 minutes, 2-3 times a day. Wear a supportive bra, especially during exercise. You may also want to wear a supportive bra while sleeping if your breasts are very tender. Medicines Take over-the-counter and prescription medicines only as told by your health care provider. If the cause of your pain is infection, you may be prescribed an antibiotic medicine. If you were prescribed an antibiotic, take it as told by your health care provider. Do not stop taking the antibiotic even if you start to feel better. Eating and drinking Your health care provider may recommend that you lessen the amount of fat in your diet. You can do this by: Limiting fried foods. Cooking foods using methods such as baking, boiling, grilling, and broiling. Decrease the amount of caffeine in your diet. Instead, drink more water and choose caffeine-free drinks. General instructions  Keep a log of the days and times when your breasts are most tender. Ask your health care provider how to do breast exams at home. This will help you notice if you have an unusual growth or lump. Keep all follow-up visits as told by your health care provider. This is important. Contact a health care provider if: Any part of your breast is hard, red, and hot to the touch. This may be a sign of infection. You are a woman and: Not breastfeeding and you have fluid, especially blood or pus, coming out of your nipples. Have a new or painful lump in your breast that remains after your menstrual period ends. You have a fever. Your pain  does not improve or it gets worse. Your pain is interfering with your daily activities. Summary Breast tenderness may range from mild discomfort to severe pain. Breast tenderness has many possible causes, including hormone changes, infections, and taking certain medicines. It can be treated with ice, wearing a supportive bra, and medicines. Make changes to your diet if told to by your health care provider. This information is not intended to replace advice given to you by your health care provider. Make sure you discuss any questions you have with your health care provider. Document Revised: 11/13/2018 Document Reviewed: 11/13/2018 Elsevier Patient Education  2023 [employer] Managing the Challenge of Quitting Smoking Quitting smoking is a physical and mental challenge. You may have cravings, withdrawal symptoms, and temptation to smoke.  Before quitting, work with your health care provider to make a plan that can help you manage quitting. Making a plan before you quit may keep you from smoking when you have the urge to smoke while trying to quit. How to manage lifestyle changes Managing stress Stress can make you want to smoke, and wanting to smoke may cause stress. It is important to find ways to manage your stress. You could try some of the following: Practice relaxation techniques. Breathe slowly and deeply, in through your nose and out through your mouth. Listen to music. Soak in a bath or take a shower. Imagine a peaceful place or vacation. Get some support. Talk with family or friends about your stress. Join a support group. Talk with a counselor or therapist. Get some physical activity. Go for a walk, run, or bike ride. Play a favorite sport. Practice yoga.  Medicines Talk with your health care provider about medicines that might help you deal with cravings and make quitting easier for you. Relationships Social situations can be difficult when you are quitting smoking. To  manage this, you can: Avoid parties and other social situations where people might be smoking. Avoid alcohol. Leave right away if you have the urge to smoke. Explain to your family and friends that you are quitting smoking. Ask for support and let them know you might be a bit grumpy. Plan activities where smoking is not an option. General instructions Be aware that many people gain weight after they quit smoking. However, not everyone does. To keep from gaining weight, have a plan in place before you quit, and stick to the plan after you quit. Your plan should include: Eating healthy snacks. When you have a craving, it may help to: Eat popcorn, or try carrots, celery, or other cut vegetables. Chew sugar-free gum. Changing how you eat. Eat small portion sizes at meals. Eat 4-6 small meals throughout the day instead of 1-2 large meals a day. Be mindful when you eat. You should avoid watching television or doing other things that might distract you as you eat. Exercising regularly. Make time to exercise each day. If you do not have time for a long workout, do short bouts of exercise for 5-10 minutes several times a day. Do some form of strengthening exercise, such as weight lifting. Do some exercise that gets your heart beating and causes you to breathe deeply, such as walking fast, running, swimming, or biking. This is very important. Drinking plenty of water or other low-calorie or no-calorie drinks. Drink enough fluid to keep your urine pale yellow.  How to recognize withdrawal symptoms Your body and mind may experience discomfort as you try to get used to not having nicotine in your system. These effects are called withdrawal symptoms. They may include: Feeling hungrier than normal. Having trouble concentrating. Feeling irritable or restless. Having trouble sleeping. Feeling depressed. Craving a cigarette. These symptoms may surprise you, but they are normal to have when quitting  smoking. To manage withdrawal symptoms: Avoid places, people, and activities that trigger your cravings. Remember why you want to quit. Get plenty of sleep. Avoid coffee and other drinks that contain caffeine. These may worsen some of your symptoms. How to manage cravings Come up with a plan for how to deal with your cravings. The plan should include the following: A definition of the specific situation you want to deal with. An activity or action you will take to replace smoking. A clear idea for how this action will  help. The name of someone who could help you with this. Cravings usually last for 5-10 minutes. Consider taking the following actions to help you with your plan to deal with cravings: Keep your mouth busy. Chew sugar-free gum. Suck on hard candies or a straw. Brush your teeth. Keep your hands and body busy. Change to a different activity right away. Squeeze or play with a ball. Do an activity or a hobby, such as making bead jewelry, practicing needlepoint, or working with wood. Mix up your normal routine. Take a short exercise break. Go for a quick walk, or run up and down stairs. Focus on doing something kind or helpful for someone else. Call a friend or family member to talk during a craving. Join a support group. Contact a quitline. Where to find support To get help or find a support group: Call the [facility]'s Smoking Quitline: 1-800-QUIT-NOW 520-738-7687) Text QUIT to SmokefreeTXTAZ:4618977 Where to find more information Visit these websites to find more information on quitting smoking: U.S. Department of Health and Human Services: www.smokefree.gov American Lung Association: www.freedomfromsmoking.org Centers for Disease Control and Prevention (CDC): http://www.wolf.info/ American Heart Association: www.heart.org Contact a health care provider if: You want to change your plan for quitting. The medicines you are taking are not helping. Your eating feels out  of control or you cannot sleep. You feel depressed or become very anxious. Summary Quitting smoking is a physical and mental challenge. You will face cravings, withdrawal symptoms, and temptation to smoke again. Preparation can help you as you go through these challenges. Try different techniques to manage stress, handle social situations, and prevent weight gain. You can deal with cravings by keeping your mouth busy (such as by chewing gum), keeping your hands and body busy, calling family or friends, or contacting a quitline for people who want to quit smoking. You can deal with withdrawal symptoms by avoiding places where people smoke, getting plenty of rest, and avoiding drinks that contain caffeine. This information is not intended to replace advice given to you by your health care provider. Make sure you discuss any questions you have with your health care provider. Document Revised: 06/12/2021 Document Reviewed: 06/12/2021 Elsevier Patient Education  [place].

## 2022-01-21 NOTE — Progress Notes (Signed)
56 y.o. G0. Married White or Caucasian Not Hispanic or Latino female here for annual exam.  No vaginal bleeding.  Sexually active, same partner x 3 years. No dyspareunia.   Under lots of stress financially. She has a therapist. Hasn't done well on antidepressants in the past.   No bowel or bladder c/o.   For the last year ~1-2 x a month she notices tenderness behind her right nipple.  She has decreased her caffeine intake and thinks it is helping.   No LMP recorded. Patient is postmenopausal.          Sexually active: Yes.    The current method of family planning is post menopausal status.   Exercising: Yes.    The patient has a physically strenuous job, but has no regular exercise apart from work.  Smoker:  yes 0.75 packs a day, considering quitting    Health Maintenance: Pap:  unsure  History of abnormal Pap:  no MMG:  2016 Bi-rads 1 normal  BMD:   none Colonoscopy: none  TDaP:  2014  Gardasil: n/a   reports that she has been smoking cigarettes. She has been smoking an average of .75 packs per day. She has never used smokeless tobacco. She reports current alcohol use of about 12.0 standard drinks of alcohol per week. She reports that she does not use drugs. Professional gardner. 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 She is still missing him.   Past Medical History:  Diagnosis Date   Hypertension    Thyroid disease     Past Surgical History:  Procedure Laterality Date   ELBOW SURGERY     TONSILLECTOMY      Current Outpatient Medications  Medication Sig Dispense Refill   cyclobenzaprine (FLEXERIL) 5 MG tablet Take 1 tablet (5 mg total) by mouth 2 (two) times daily as needed for muscle spasms. 15 tablet 0   hydrochlorothiazide (HYDRODIURIL) 25 MG tablet Take 25 mg by mouth daily.     ibuprofen (ADVIL) 600 MG tablet Take 1 tablet (600 mg total) by mouth every 6 (six) hours as needed for mild pain. 30 tablet 0   levothyroxine (SYNTHROID, LEVOTHROID) 100 MCG tablet Take 100 mcg by  mouth daily before breakfast.     lisinopril (ZESTRIL) 20 MG tablet Take 10-20 mg by mouth every morning.     No current facility-administered medications for this visit.    Family History  Problem Relation Age of Onset   Healthy Father    Healthy Brother     Review of Systems  All other systems reviewed and are negative.   Exam:   BP 130/82   Pulse 88   Ht 5\' 1"  (1.549 m)   Wt 148 lb (67.1 kg)   SpO2 99%   BMI 27.96 kg/m   Weight change: @WEIGHTCHANGE @ Height:   Height: 5\' 1"  (154.9 cm)  Ht Readings from Last 3 Encounters:  01/21/22 5\' 1"  (1.549 m)  08/11/21 5\' 1"  (1.549 m)  08/11/21 5\' 1"  (1.549 m)    General appearance: alert, cooperative and appears stated age Head: Normocephalic, without obvious abnormality, atraumatic Neck: no adenopathy, supple, symmetrical, trachea midline and thyroid normal to inspection and palpation Lungs: clear to auscultation bilaterally Cardiovascular: regular rate and rhythm Breasts: normal appearance, no masses or tenderness Abdomen: soft, non-tender; non distended,  no masses,  no organomegaly Extremities: extremities normal, atraumatic, no cyanosis or edema Skin: Skin color, texture, turgor normal. No rashes or lesions Lymph nodes: Cervical, supraclavicular, and axillary nodes normal.  No abnormal inguinal nodes palpated Neurologic: Grossly normal   Pelvic: External genitalia:  no lesions              Urethra:  normal appearing urethra with no masses, tenderness or lesions              Bartholins and Skenes: normal                 Vagina: normal appearing vagina with normal color and discharge, no lesions              Cervix: no lesions               Bimanual Exam:  Uterus:  normal size, contour, position, consistency, mobility, non-tender              Adnexa: no mass, fullness, tenderness               Rectovaginal: Confirms               Anus:  normal sphincter tone, no lesions    1. Well woman exam Discussed breast self  exam Discussed calcium and vit D intake Labs with primary I think she has mild depression, we discussed medication and she declines  2. Screening for cervical cancer - Cytology - PAP  3. Colon cancer screening - Ambulatory referral to Gastroenterology  4. Breast pain Improving with a decrease in caffeine intake. Normal exam She will get her screening mammogram. If the pain doesn't continue to improve will order diagnostic imaging.

## 2022-01-22 LAB — CYTOLOGY - PAP
Comment: NEGATIVE
Diagnosis: NEGATIVE
Diagnosis: REACTIVE
High risk HPV: NEGATIVE

## 2022-02-05 ENCOUNTER — Encounter: Payer: Self-pay | Admitting: Nurse Practitioner

## 2022-02-05 ENCOUNTER — Ambulatory Visit (INDEPENDENT_AMBULATORY_CARE_PROVIDER_SITE_OTHER): Payer: Commercial Managed Care - HMO | Admitting: Nurse Practitioner

## 2022-02-05 VITALS — BP 130/78 | HR 69 | Temp 97.5°F | Wt 143.6 lb

## 2022-02-05 DIAGNOSIS — M5442 Lumbago with sciatica, left side: Secondary | ICD-10-CM | POA: Diagnosis not present

## 2022-02-05 DIAGNOSIS — E039 Hypothyroidism, unspecified: Secondary | ICD-10-CM

## 2022-02-05 DIAGNOSIS — Z23 Encounter for immunization: Secondary | ICD-10-CM

## 2022-02-05 DIAGNOSIS — I10 Essential (primary) hypertension: Secondary | ICD-10-CM

## 2022-02-05 DIAGNOSIS — G8929 Other chronic pain: Secondary | ICD-10-CM | POA: Diagnosis not present

## 2022-02-05 DIAGNOSIS — Z1211 Encounter for screening for malignant neoplasm of colon: Secondary | ICD-10-CM | POA: Diagnosis not present

## 2022-02-05 MED ORDER — TRIAMCINOLONE ACETONIDE 40 MG/ML IJ SUSP
40.0000 mg | Freq: Once | INTRAMUSCULAR | Status: AC
  Administered 2022-02-05: 40 mg via INTRAMUSCULAR

## 2022-02-05 MED ORDER — GABAPENTIN 300 MG PO CAPS: 300.0000 mg | ORAL_CAPSULE | Freq: Every day | ORAL | 3 refills | Status: DC

## 2022-02-05 NOTE — Assessment & Plan Note (Signed)
Chronic, stable.  BP today 130/78.  Continue lisinopril 20 mg daily and hydrochlorothiazide 25 mg daily.  We will check labs next visit.
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
## 2022-02-05 NOTE — Assessment & Plan Note (Signed)
Chronic, stable.  She states that her TSH within the last year was normal.  Continue levothyroxine 88 mcg daily.  We will check labs next visit.

## 2022-02-05 NOTE — Assessment & Plan Note (Signed)
Chronic with exacerbation.  She has been having severe pain in her left lower back radiating down her leg for the past 2 weeks.  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  No red flags on exam.  Symptoms are consistent with left-sided sciatica.  We will treat with Kenalog injection 40 mg in office and gabapentin 300 mg at bedtime.  Stretches given to her to start daily.  Follow-up in 1 to 2 weeks.

## 2022-02-05 NOTE — Patient Instructions (Signed)
It was great to see you!  Start gabapentin 1 capsule at bedtime. I have attached some stretches that you can do. Do not do them if it hurts your further.   Let's follow-up in 1-2 weeks, sooner if you have concerns.  If a referral was placed today, you will be contacted for an appointment. Please note that routine referrals can sometimes take up to 3-4 weeks to process. Please call our office if you haven't heard anything after this time frame.  Take care,  Rodman Pickle, NP

## 2022-02-09 ENCOUNTER — Telehealth: Payer: Self-pay | Admitting: Nurse Practitioner

## 2022-02-09 NOTE — Telephone Encounter (Signed)
Caller Name: Julieta Call back phone #: [phone]  MEDICATION(S):  cyclobenzaprine (FLEXERIL) 5 MG tablet  takes 2/day everyday for muscle spasms - was filled by urgent care previously  ibuprofen (ADVIL) 600 MG tablet takes q6h prn - was filled by urgent care previously  Takes both for sciatica   Days of Med Remaining: out  Preferred Pharmacy:  [id number] - [place], [place] - [address]  [address] [place] [address]  Phone:  [phone]  Fax:  [phone]

## 2022-02-10 MED ORDER — CYCLOBENZAPRINE HCL 5 MG PO TABS: 5.0000 mg | ORAL_TABLET | Freq: Two times a day (BID) | ORAL | 1 refills | Status: AC | PRN

## 2022-02-10 MED ORDER — IBUPROFEN 600 MG PO TABS: 600.0000 mg | ORAL_TABLET | Freq: Four times a day (QID) | ORAL | 1 refills | Status: DC | PRN

## 2022-02-12 NOTE — Progress Notes (Unsigned)
   Established Patient Office Visit  Subjective   Patient ID: Carol Ho, female    DOB: 1966/03/02  Age: 56 y.o. MRN: [id number]  No chief complaint on file.   HPI  Carol Ho is here to follow-up on chronic low back pain with sciatica. Last visit, she was experiencing a flare-up of her symptoms. She was given a kenalog IM injection and started on gabapentin 300mg  last visit.   {History (Optional):23778}  ROS    Objective:     There were no vitals taken for this visit. {Vitals History (Optional):23777}  Physical Exam   No results found for any visits on 02/15/22.  {Labs (Optional):23779}  The ASCVD Risk score (Arnett DK, et al., 2019) failed to calculate for the following reasons:   Cannot find a previous HDL lab   Cannot find a previous total cholesterol lab    Assessment & Plan:   Problem List Items Addressed This Visit   None   No follow-ups on file.    2020, NP

## 2022-02-15 ENCOUNTER — Ambulatory Visit (INDEPENDENT_AMBULATORY_CARE_PROVIDER_SITE_OTHER): Payer: Commercial Managed Care - HMO | Admitting: Nurse Practitioner

## 2022-02-15 ENCOUNTER — Ambulatory Visit (INDEPENDENT_AMBULATORY_CARE_PROVIDER_SITE_OTHER): Payer: Commercial Managed Care - HMO

## 2022-02-15 ENCOUNTER — Encounter: Payer: Self-pay | Admitting: Nurse Practitioner

## 2022-02-15 VITALS — BP 142/80 | HR 71 | Wt 147.4 lb

## 2022-02-15 DIAGNOSIS — E039 Hypothyroidism, unspecified: Secondary | ICD-10-CM | POA: Diagnosis not present

## 2022-02-15 DIAGNOSIS — G8929 Other chronic pain: Secondary | ICD-10-CM | POA: Diagnosis not present

## 2022-02-15 DIAGNOSIS — Z720 Tobacco use: Secondary | ICD-10-CM

## 2022-02-15 DIAGNOSIS — I10 Essential (primary) hypertension: Secondary | ICD-10-CM | POA: Diagnosis not present

## 2022-02-15 DIAGNOSIS — M5442 Lumbago with sciatica, left side: Secondary | ICD-10-CM

## 2022-02-15 DIAGNOSIS — Z13220 Encounter for screening for lipoid disorders: Secondary | ICD-10-CM

## 2022-02-15 NOTE — Progress Notes (Unsigned)
Chronic back pain wiith left-sided sciatica Denies injury All view standing

## 2022-02-15 NOTE — Patient Instructions (Signed)
It was great to see you!  We are checking your labs today and will let you know the results via mychart/phone.   Let's follow-up in 3 months, sooner if you have concerns.  If a referral was placed today, you will be contacted for an appointment. Please note that routine referrals can sometimes take up to 3-4 weeks to process. Please call our office if you haven't heard anything after this time frame.  Take care,  Sharon Rubis, NP  

## 2022-02-15 NOTE — Assessment & Plan Note (Signed)
Chronic, slightly elevated today.  She has been having a lot of pain and is unable to sit down.  BP today 142/80.  Continue lisinopril 20 mg daily and hydrochlorothiazide 25 mg daily.  Check CMP, CBC today.  Follow-up 3 months

## 2022-02-15 NOTE — Assessment & Plan Note (Signed)
Chronic, ongoing back pain.  She states that her symptoms have slightly improved since starting the gabapentin at bedtime and the Kenalog IM injection last visit.  She is still having some pain when she sits for long periods of time, gets in and out of her car, or wakes up in the morning.  She is also been taking Flexeril 5 mg as needed and ibuprofen 600 mg as needed.  We will continue this regimen.  Encouraged her to continue her stretches.  We will check an x-ray of her lumbar spine today.  She declines referral to orthopedics at this point, however if she is still having ongoing symptoms she will let us know.

## 2022-02-15 NOTE — Assessment & Plan Note (Signed)
Currently smoking about three quarters a pack a day.  Encourage complete tobacco cessation

## 2022-02-15 NOTE — Assessment & Plan Note (Signed)
Continue levothyroxine 88 mcg daily and we will check TSH today and adjust regimen based on results

## 2022-02-16 LAB — COMPREHENSIVE METABOLIC PANEL
ALT: 13 U/L (ref 0–35)
AST: 17 U/L (ref 0–37)
Albumin: 4.7 g/dL (ref 3.5–5.2)
Alkaline Phosphatase: 59 U/L (ref 39–117)
BUN: 15 mg/dL (ref 6–23)
CO2: 27 mEq/L (ref 19–32)
Calcium: 9.7 mg/dL (ref 8.4–10.5)
Chloride: 94 mEq/L — ABNORMAL LOW (ref 96–112)
Creatinine, Ser: 0.69 mg/dL (ref 0.40–1.20)
GFR: 95.57 mL/min (ref >60.00)
Glucose, Bld: 94 mg/dL (ref 70–99)
Potassium: 3.7 mEq/L (ref 3.5–5.1)
Sodium: 132 mEq/L — ABNORMAL LOW (ref 135–145)
Total Bilirubin: 0.3 mg/dL (ref 0.2–1.2)
Total Protein: 7.1 g/dL (ref 6.0–8.3)

## 2022-02-16 LAB — LIPID PANEL
Cholesterol: 207 mg/dL — ABNORMAL HIGH (ref 0–200)
HDL: 90.5 mg/dL (ref >39.00)
LDL Cholesterol: 93 mg/dL (ref 0–99)
NonHDL: 116.81
Total CHOL/HDL Ratio: 2
Triglycerides: 121 mg/dL (ref 0.0–149.0)
VLDL: 24.2 mg/dL (ref 0.0–40.0)

## 2022-02-16 LAB — CBC
HCT: 43.2 % (ref 36.0–46.0)
Hemoglobin: 14.9 g/dL (ref 12.0–15.0)
MCHC: 34.5 g/dL (ref 30.0–36.0)
MCV: 101.1 fl — ABNORMAL HIGH (ref 78.0–100.0)
Platelets: 260 10*3/uL (ref 150.0–400.0)
RBC: 4.27 Mil/uL (ref 3.87–5.11)
RDW: 12.6 % (ref 11.5–15.5)
WBC: 9.2 10*3/uL (ref 4.0–10.5)

## 2022-02-16 LAB — TSH: TSH: 0.63 u[IU]/mL (ref 0.35–5.50)

## 2022-02-17 ENCOUNTER — Telehealth: Payer: Self-pay | Admitting: Nurse Practitioner

## 2022-02-17 DIAGNOSIS — G8929 Other chronic pain: Secondary | ICD-10-CM

## 2022-02-17 NOTE — Telephone Encounter (Signed)
PCP placed referral to orthopedist specialist.

## 2022-02-17 NOTE — Telephone Encounter (Signed)
Pt would like the referral to the orthopedist that was discussed at her last appt.

## 2022-02-26 ENCOUNTER — Ambulatory Visit (INDEPENDENT_AMBULATORY_CARE_PROVIDER_SITE_OTHER): Payer: Commercial Managed Care - HMO | Admitting: Surgery

## 2022-02-26 ENCOUNTER — Encounter: Payer: Self-pay | Admitting: Surgery

## 2022-02-26 VITALS — BP 160/89 | HR 77 | Ht 60.0 in | Wt 145.0 lb

## 2022-02-26 DIAGNOSIS — M4726 Other spondylosis with radiculopathy, lumbar region: Secondary | ICD-10-CM

## 2022-02-26 MED ORDER — KETOROLAC TROMETHAMINE 30 MG/ML IJ SOLN
30.0000 mg | Freq: Once | INTRAMUSCULAR | Status: AC
Start: 2022-02-26 — End: 2022-03-03

## 2022-02-26 MED ORDER — METHYLPREDNISOLONE 4 MG PO TABS: ORAL_TABLET | ORAL | 0 refills | Status: DC

## 2022-02-26 NOTE — Progress Notes (Signed)
Per Zonia Kief, patient was given IM injection of Toradol, 30mg  into the left glute.  Patient  tolerated IM injection well.  Advised to call the office with any concerns.

## 2022-02-26 NOTE — Progress Notes (Signed)
Office Visit Note   Patient: Carol Ho           Date of Birth: 12/04/1965           MRN: [id number] Visit Date: 02/26/2022              Requested by: Gerre Scull, NP [address] [place],  [place] [address] PCP: Gerre Scull, NP   Assessment & Plan: Visit Diagnoses:  1. Other spondylosis with radiculopathy, lumbar region     Plan: At this point recommend conservative treatment with Medrol Dosepak 6-day taper to be taken as directed and this was sent to her pharmacy.  Patient given Toradol 30 mg IM injection today in clinic.  We will also refer patient to formal PT for a few weeks.  Follow-up with me in 3 weeks for recheck.  If she has not had any improvement I will plan to get a lumbar MRI to rule out HNP/stenosis.  All questions answered.  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  She voices understanding.  Follow-Up Instructions: Return in about 3 weeks (around 03/19/2022) for WITH Salomon Ganser RECHECK LUMBAR.   Orders:  Orders Placed This Encounter  Procedures   Ambulatory referral to Physical Therapy   Meds ordered this encounter  Medications   methylPREDNISolone (MEDROL) 4 MG tablet    Sig: 6 day taper to be taken as directed.    Dispense:  21 tablet    Refill:  0      Procedures: No procedures performed   Clinical Data: No additional findings.   Subjective: Chief Complaint  Patient presents with   Lower Back - Pain   Left Leg - Pain    HPI Patient comes in the request of her primary care provider for ongoing issues with low back pain and left lower extremity radicular symptoms to her foot.  No right leg symptoms..  They states that this problems been ongoing about 6 to 8 weeks.  She has had off-and-on issues with her back over the years.  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  Patient was seen at the  [facility] health urgent care April 30, 2021 for back pain.  That was managed conservatively with Flexeril and ibuprofen.  Seen by primary care provider February 05, 2022 and received a Kenalog 40 mg injection and prescription for gabapentin.  Has not had any improvement with this. Review of Systems No current complaints of cardiopulmonary GI/GU issues  Objective: Vital Signs: BP (!) 160/89   Pulse 77   Ht 5' (1.524 m)   Wt 145 lb (65.8 kg)   BMI 28.32 kg/m   Physical Exam HENT:     Head: Normocephalic.     Nose: Nose normal.  Eyes:     Extraocular Movements: Extraocular movements intact.  Pulmonary:     Effort: No respiratory distress.  Musculoskeletal:     Comments: Gait is somewhat antalgic.  Limited lumbar flexion extension due to pain.  Positive bilateral lumbar paraspinal tenderness/spasm.  Positive left-sided notch tenderness.  Nontender over the bilateral hip greater trochanter bursa.  Positive left straight leg raise.  No focal motor deficits.  Psychiatric:        Mood and Affect: Mood normal.     Ortho Exam  Specialty Comments:  No specialty comments available.  Imaging: No results found.  PMFS History: Patient Active Problem List   Diagnosis Date Noted   Tobacco use 02/15/2022   Chronic left-sided low back pain with left-sided sciatica 02/05/2022   Daytime somnolence 01/21/2022   Obstructive sleep apnea (adult) (pediatric) 01/21/2022   Bursitis of right shoulder 01/05/2021   Alcohol use 07/03/2015   Benign essential hypertension 07/03/2015   Discolored skin 07/03/2015   Elbow tendonitis 07/03/2015   Generalized anxiety disorder 07/03/2015   High risk medication use 07/03/2015   Hypothyroidism 07/03/2015   Neck pain 07/03/2015   Tremor of left hand 07/03/2015   Weakness of left upper extremity 07/03/2015   Past Medical History:  Diagnosis Date   Hypertension    Thyroid disease     Family History  Problem Relation Age of Onset   Hypertension Mother     Healthy Father    Healthy Brother     Past Surgical History:  Procedure Laterality Date   ELBOW SURGERY     TONSILLECTOMY     Social History   Occupational History   Not on file  Tobacco Use   Smoking status: Every Day    Packs/day: 0.75    Years: 20.00    Total pack years: 15.00    Types: Cigarettes   Smokeless tobacco: Never  Vaping Use   Vaping Use: Never used  Substance and Sexual Activity   Alcohol use: Yes    Alcohol/week: 12.0 standard drinks of alcohol    Types: 12 Cans of beer per week   Drug use: Never   Sexual activity: Yes    Birth control/protection: Post-menopausal

## 2022-03-10 LAB — COLOGUARD: COLOGUARD: NEGATIVE

## 2022-03-11 NOTE — Therapy (Signed)
OUTPATIENT PHYSICAL THERAPY THORACOLUMBAR EVALUATION   Patient Name: Carol Ho MRN: [id number] DOB:07-20-1965, 56 y.o., female Today's Date: 03/11/2022    Past Medical History:  Diagnosis Date   Hypertension    Thyroid disease    Past Surgical History:  Procedure Laterality Date   ELBOW SURGERY     TONSILLECTOMY     Patient Active Problem List   Diagnosis Date Noted   Tobacco use 02/15/2022   Chronic left-sided low back pain with left-sided sciatica 02/05/2022   Daytime somnolence 01/21/2022   Obstructive sleep apnea (adult) (pediatric) 01/21/2022   Bursitis of right shoulder 01/05/2021   Alcohol use 07/03/2015   Benign essential hypertension 07/03/2015   Discolored skin 07/03/2015   Elbow tendonitis 07/03/2015   Generalized anxiety disorder 07/03/2015   High risk medication use 07/03/2015   Hypothyroidism 07/03/2015   Neck pain 07/03/2015   Tremor of left hand 07/03/2015   Weakness of left upper extremity 07/03/2015    PCP: Gerre Scull, NP  REFERRING PROVIDER: Naida Sleight, PA-C  REFERRING DIAG: [phone] (ICD-10-CM) - Other spondylosis with radiculopathy, lumbar region  RATIONALE FOR EVALUATION AND TREATMENT: Rehabilitation  THERAPY DIAG:  No diagnosis found.  ONSET DATE: ***  NEXT MD VISIT:  03/25/22   SUBJECTIVE:                                                                                                                                                                                           SUBJECTIVE STATEMENT: ***  Lumbar spondylosis.  Low back pain and left LE radiculopathy.  Evaluate and treat as needed x 3-4 weeks.  PAIN:  Are you having pain? {OPRCPAIN:27236}  PERTINENT HISTORY: ***  PRECAUTIONS: {Therapy precautions:24002}  WEIGHT BEARING RESTRICTIONS: {Yes ***/No:24003}  FALLS:  Has patient fallen in last 6 months? {fallsyesno:27318}  LIVING ENVIRONMENT: Lives with: {OPRC lives with:25569::"lives with their  family"} Lives in: {Lives in:25570} Stairs: {opstairs:27293} Has following equipment at home: {Assistive devices:23999}  OCCUPATION: ***  PLOF: {PLOF:24004}  PATIENT GOALS: ***   OBJECTIVE:   DIAGNOSTIC FINDINGS:  ***02/15/22 - Lumbar x-ray: Mild spondylosis, slightly progressed from 2014. No acute abnormality.  PATIENT SURVEYS:  {rehab surveys:24030}  SCREENING FOR RED FLAGS: Bowel or bladder incontinence: {Yes/No:304960894} Spinal tumors: {Yes/No:304960894} Cauda equina syndrome: {Yes/No:304960894} Compression fracture: {Yes/No:304960894} Abdominal aneurysm: {Yes/No:304960894}  COGNITION:  Overall cognitive status: {cognition:24006}     SENSATION: {sensation:27233}  MUSCLE LENGTH: Hamstrings: Right *** deg; Left *** deg Thomas test: Right *** deg; Left *** deg Hamstrings: *** ITB: *** Piriformis: *** Hip flexors: *** Quads: *** Heelcord: ***  POSTURE:  {posture:25561}  PALPATION: ***  LUMBAR ROM:   {  AROM/PROM:27142}  A/PROM  eval  Flexion   Extension   Right lateral flexion   Left lateral flexion   Right rotation   Left rotation    (Blank rows = not tested)  LOWER EXTREMITY ROM:     {AROM/PROM:27142}  Right eval Left eval  Hip flexion    Hip extension    Hip abduction    Hip adduction    Hip internal rotation    Hip external rotation    Knee flexion    Knee extension    Ankle dorsiflexion    Ankle plantarflexion    Ankle inversion    Ankle eversion     (Blank rows = not tested)  LOWER EXTREMITY MMT:    MMT Right eval Left eval  Hip flexion    Hip extension    Hip abduction    Hip adduction    Hip internal rotation    Hip external rotation    Knee flexion    Knee extension    Ankle dorsiflexion    Ankle plantarflexion    Ankle inversion    Ankle eversion     (Blank rows = not tested)  LUMBAR SPECIAL TESTS:  {lumbar special test:25242}  FUNCTIONAL TESTS:  {Functional tests:24029}  GAIT: Distance walked:  *** Assistive device utilized: {Assistive devices:23999} Level of assistance: {Levels of assistance:24026} Gait pattern: {gait characteristics:25376} Comments: ***   TODAY'S TREATMENT: ***   PATIENT EDUCATION:  Education details: {Education details:27468} Person educated: {Person educated:25204} Education method: {Education Method:25205} Education comprehension: {Education Comprehension:25206}   HOME EXERCISE PROGRAM: ***  ASSESSMENT:  CLINICAL IMPRESSION: Carol Ho is a 56 y.o. female who was seen today for physical therapy evaluation and treatment for ***.    OBJECTIVE IMPAIRMENTS: {opptimpairments:25111}.   ACTIVITY LIMITATIONS: {activitylimitations:27494}  PARTICIPATION LIMITATIONS: {participationrestrictions:25113}  PERSONAL FACTORS: {Personal factors:25162} are also affecting patient's functional outcome.   REHAB POTENTIAL: {rehabpotential:25112}  CLINICAL DECISION MAKING: {clinical decision making:25114}  EVALUATION COMPLEXITY: {Evaluation complexity:25115}   GOALS: Goals reviewed with patient? {yes/no:20286}  SHORT TERM GOALS: Target date: {follow up:25551} (Remove Blue Hyperlink)  Patient will be independent with initial HEP to improve outcomes and carryover.  Baseline: *** Goal status: {GOALSTATUS:25110}  2.  Patient will report centralization of radicular symptoms.  Baseline: *** Goal status: {GOALSTATUS:25110}  3.  *** Baseline: *** Goal status: {GOALSTATUS:25110}   LONG TERM GOALS: Target date: {follow up:25551}  (Remove Blue Hyperlink)  Patient will be independent with ongoing/advanced HEP for self-management at home.  Baseline: *** Goal status: {GOALSTATUS:25110}  2.  Patient will report 75% improvement in low back pain to improve QOL.  Baseline: *** Goal status: {GOALSTATUS:25110}  3.  Patient to demonstrate ability to achieve and maintain good spinal alignment/posturing and body mechanics needed for daily  activities. Baseline: *** Goal status: {GOALSTATUS:25110}  4.  Patient will demonstrate full pain free lumbar ROM to perform ADLs.   Baseline: *** Goal status: {GOALSTATUS:25110}  5.  Patient will demonstrate improved *** strength to >/= ***/5 for improved stability and ease of mobility . Baseline: *** Goal status: {GOALSTATUS:25110}  6.  Patient will report *** on lumbar FOTO to demonstrate improved functional ability.  Baseline: *** Goal status: {GOALSTATUS:25110}   7.  Patient will tolerate *** min of (standing/sitting/walking) to perform ***. Baseline: *** Goal status: {GOALSTATUS:25110}  8.  *** Baseline: *** Goal status: {GOALSTATUS:25110}    PLAN: PT FREQUENCY: {rehab frequency:25116}  PT DURATION: {rehab duration:25117}  PLANNED INTERVENTIONS: {rehab planned interventions:25118::"Therapeutic exercises","Therapeutic activity","Neuromuscular re-education","Balance training","Gait training","Patient/Family education","Joint mobilization"}.  PLAN FOR NEXT SESSION: ***   Marry Guan, PT 03/11/2022, 7:06 PM

## 2022-03-15 ENCOUNTER — Encounter: Payer: Self-pay | Admitting: Physical Therapy

## 2022-03-15 ENCOUNTER — Other Ambulatory Visit: Payer: Self-pay

## 2022-03-15 ENCOUNTER — Ambulatory Visit: Payer: Commercial Managed Care - HMO | Attending: Surgery | Admitting: Physical Therapy

## 2022-03-15 DIAGNOSIS — R252 Cramp and spasm: Secondary | ICD-10-CM

## 2022-03-15 DIAGNOSIS — M5416 Radiculopathy, lumbar region: Secondary | ICD-10-CM | POA: Diagnosis present

## 2022-03-15 DIAGNOSIS — R2689 Other abnormalities of gait and mobility: Secondary | ICD-10-CM | POA: Diagnosis present

## 2022-03-15 DIAGNOSIS — M4726 Other spondylosis with radiculopathy, lumbar region: Secondary | ICD-10-CM | POA: Insufficient documentation

## 2022-03-15 DIAGNOSIS — M6281 Muscle weakness (generalized): Secondary | ICD-10-CM | POA: Diagnosis present

## 2022-03-15 DIAGNOSIS — R262 Difficulty in walking, not elsewhere classified: Secondary | ICD-10-CM | POA: Diagnosis present

## 2022-03-17 ENCOUNTER — Ambulatory Visit: Payer: Commercial Managed Care - HMO

## 2022-03-17 DIAGNOSIS — R2689 Other abnormalities of gait and mobility: Secondary | ICD-10-CM

## 2022-03-17 DIAGNOSIS — M5416 Radiculopathy, lumbar region: Secondary | ICD-10-CM

## 2022-03-17 DIAGNOSIS — R262 Difficulty in walking, not elsewhere classified: Secondary | ICD-10-CM

## 2022-03-17 DIAGNOSIS — R252 Cramp and spasm: Secondary | ICD-10-CM

## 2022-03-17 DIAGNOSIS — M6281 Muscle weakness (generalized): Secondary | ICD-10-CM

## 2022-03-17 NOTE — Therapy (Signed)
OUTPATIENT PHYSICAL THERAPY TREATMENT   Patient Name: Carol Ho MRN: [id number] DOB:1965/07/22, 56 y.o., female Today's Date: 03/17/2022   PT End of Session - 03/17/22 1510     Visit Number 2    Date for PT Re-Evaluation 04/26/22    Authorization Type [employer]    PT Start Time 1402    PT Stop Time 1445    PT Time Calculation (min) 43 min    Activity Tolerance Patient tolerated treatment well    Behavior During Therapy [facility] for tasks assessed/performed              Past Medical History:  Diagnosis Date   Hypertension    Thyroid disease    Past Surgical History:  Procedure Laterality Date   ELBOW SURGERY     TONSILLECTOMY     Patient Active Problem List   Diagnosis Date Noted   Tobacco use 02/15/2022   Chronic left-sided low back pain with left-sided sciatica 02/05/2022   Daytime somnolence 01/21/2022   Obstructive sleep apnea (adult) (pediatric) 01/21/2022   Bursitis of right shoulder 01/05/2021   Alcohol use 07/03/2015   Benign essential hypertension 07/03/2015   Discolored skin 07/03/2015   Elbow tendonitis 07/03/2015   Generalized anxiety disorder 07/03/2015   High risk medication use 07/03/2015   Hypothyroidism 07/03/2015   Neck pain 07/03/2015   Tremor of left hand 07/03/2015   Weakness of left upper extremity 07/03/2015    PCP: Gerre Scull, NP  REFERRING PROVIDER: Naida Sleight, PA-C  REFERRING DIAG: [phone] (ICD-10-CM) - Other spondylosis with radiculopathy, lumbar region  RATIONALE FOR EVALUATION AND TREATMENT: Rehabilitation  THERAPY DIAG:  Radiculopathy, lumbar region  Cramp and spasm  Muscle weakness (generalized)  Other abnormalities of gait and mobility  Difficulty in walking, not elsewhere classified  ONSET DATE: 3+ months  NEXT MD VISIT:  03/25/22   SUBJECTIVE:                                                                                                                                                                                            SUBJECTIVE STATEMENT: Pt reports no change in pain since eval.  PAIN:  Are you having pain? Yes: NPRS scale: 7/10 Pain location: L buttock, shooting pain with numbness and tingling down L LE Pain description: sharp, stabbing, sometimes throbbing Aggravating factors: sit <> stand, car transfers, driving, prolonged stationary positioning in sitting or standing Relieving factors: stop what she is doing and start deep breathing  PERTINENT HISTORY: HTN, thyroid disease/hypothyroidism, neck pain, R shoulder bursitis, elbow tendinitis, anxiety  PRECAUTIONS: None  WEIGHT BEARING RESTRICTIONS: No  FALLS:  Has  patient fallen in last 6 months? 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 No acute abnormality.  PATIENT SURVEYS:  Modified Oswestry 25/50 = 50.0 % - severe disability  FOTO Lumbar = 42, predicted = 56  SCREENING FOR RED FLAGS: Bowel or bladder incontinence: No Spinal tumors: No Cauda equina syndrome: No Compression fracture: No Abdominal aneurysm: No  COGNITION:  Overall cognitive status: Within functional limits for tasks assessed     SENSATION: WFL Intermittent radicular pain, numbness and tingling down L LE  MUSCLE LENGTH: Hamstrings: mild tight L>R ITB: mod tight L>R Piriformis: mod tight L>R Hip flexors: mild tight L>R Quads: WFL Heelcord: NT  POSTURE:  No Significant postural limitations  PALPATION: TTP in L glutes and piriformis  LUMBAR ROM:   Active  AROM  Eval - 03/15/22  Flexion Hands to ankles  Extension 25% limited - lessens pain  Right lateral flexion Hand to  lateral knee  Left lateral flexion Hand to femoral condyle  Right rotation WFL  Left rotation 25% limited   (Blank rows = not tested)  LOWER EXTREMITY ROM:     WFL  LOWER EXTREMITY MMT:    MMT Right eval Left eval  Hip flexion 4+ 4  Hip extension 4 4-  Hip abduction 4+ 4-  Hip adduction 4+ 4+  Hip internal rotation 4+ 4  Hip external rotation 4 4-  Knee flexion 5 4+  Knee extension 5 5  Ankle dorsiflexion 4+ 4+  Ankle plantarflexion    Ankle inversion    Ankle eversion     (Blank rows = not tested)  LUMBAR SPECIAL TESTS:  Straight leg raise test: Positive on L   TODAY'S TREATMENT: 03/17/22 Therapeutic Exercise: Nustep L3x26min Prone press up x 30 sec Supine L piriformis figure 4 stretch 3x15"  Manual Therapy: STM to L glutes and TPR to piriformis  03/15/22 THERAPEUTIC EXERCISE: Instruction in initial HEP (see below) to improve flexibility, strength and mobility.  Verbal and tactile cues throughout for technique.    PATIENT EDUCATION:  Education details: self-STM techniques to glutes using lacrose ball Person educated: Patient Education method: Explanation, Demonstration, Verbal cues, and Handouts Education comprehension: verbalized understanding, returned demonstration, verbal cues required, and needs further education   HOME EXERCISE PROGRAM: Access Code: [email or web address] URL: [email or web address] Date: 03/15/2022 Prepared by: Glenetta Hew  Exercises - Hooklying Hamstring Stretch with Strap  - 2-3 x daily - 7 x weekly - 3 reps - 30 sec hold - Supine ITB Stretch with Strap  - 2-3 x daily - 7 x weekly - 3 reps - 30 sec hold - Supine Piriformis Stretch with Leg Straight  - 2-3 x daily - 7 x weekly - 3 reps - 30 sec hold - Supine Figure 4 Piriformis Stretch  - 2-3 x daily - 7 x weekly - 3 reps - 30 sec hold - Supine Piriformis Stretch Pulling Heel to Hip  - 2-3 x daily - 7 x weekly - 3 reps - 30 sec hold - Supine Lower Trunk Rotation  - 2-3 x daily -  7 x weekly - 5 reps - 10 sec hold  Patient Education - Trigger Point Dry Needling  ASSESSMENT:  CLINICAL IMPRESSION: Pt reported to clinic with guarded movements and continued pain in L LE. STM was done to L glutes to reduce tightness in hopes to reduce pain. Trigger points were palpated along the piriformis. After STM pt reported just a little improvement, she would definitely benefit from DN.  OBJECTIVE IMPAIRMENTS: Abnormal gait, decreased activity tolerance, decreased endurance, decreased knowledge of condition, decreased mobility, difficulty walking, decreased ROM, decreased strength, increased fascial restrictions, impaired perceived functional ability, increased muscle spasms, impaired flexibility, improper body mechanics, postural dysfunction, and pain.   ACTIVITY LIMITATIONS: carrying, lifting, bending, sitting, standing, squatting, sleeping, stairs, transfers, bed mobility, bathing, and locomotion level  PARTICIPATION LIMITATIONS: meal prep, cleaning, laundry, driving, shopping, community activity, occupation, and yard work  PERSONAL FACTORS: Past/current experiences, Profession, Time since onset of injury/illness/exacerbation, and 3+ comorbidities: HTN, thyroid disease/hypothyroidism, neck pain, R shoulder bursitis, elbow tendinitis, anxiety  are also affecting patient's functional outcome.   REHAB POTENTIAL: Good  CLINICAL DECISION MAKING: Evolving/moderate complexity  EVALUATION COMPLEXITY: Moderate   GOALS: Goals reviewed with patient? Yes  SHORT TERM GOALS: Target date: 04/05/2022   Patient will be independent with initial HEP to improve outcomes and carryover.  Baseline:  Goal status: IN PROGRESS  2.  Patient will report centralization of radicular symptoms.  Baseline:  Goal status: IN PROGRESS  LONG TERM GOALS: Target date: 04/26/2022    Patient will be independent with ongoing/advanced HEP for self-management at home.  Baseline:  Goal status: IN  PROGRESS  2.  Patient will report 75% improvement in low back pain & L LE radicular pain to improve QOL.  Baseline:  Goal status: IN PROGRESS  3.  Patient to demonstrate ability to achieve and maintain good spinal alignment/posturing and body mechanics needed for daily activities. Baseline:  Goal status: IN PROGRESS  4.  Patient will demonstrate full pain free lumbar ROM to perform ADLs.   Baseline:  Goal status: IN PROGRESS  5.  Patient will demonstrate improved B LE strength to >/= 4+/5 for improved stability and ease of mobility . Baseline:  Goal status: IN PROGRESS  6.  Patient will report 68 on lumbar FOTO to demonstrate improved functional ability.  Baseline: Lumbar FOTO = 42 Goal status: IN PROGRESS   7.  Patient to report ability to perform ADLs, household, leisure and work-related tasks without increased pain. Baseline:  Goal status: IN PROGRESS   PLAN: PT FREQUENCY: 2x/week  PT DURATION: 6 weeks  PLANNED INTERVENTIONS: Therapeutic exercises, Therapeutic activity, Neuromuscular re-education, Balance training, Gait training, Patient/Family education, Self Care, Joint mobilization, Dry Needling, Electrical stimulation, Spinal mobilization, Cryotherapy, Moist heat, Taping, Traction, Ultrasound, Ionotophoresis 4mg /ml Dexamethasone, Manual therapy, and Re-evaluation.  PLAN FOR NEXT SESSION: Review initial HEP; progress lumbopelvic flexibility and strengthening with extension bias; MT +/- DN to address pain and abnormal muscle tension   Jeremiah Tarpley L [place], PTA 03/17/2022, 3:10 PM

## 2022-03-23 ENCOUNTER — Ambulatory Visit: Payer: 59 | Admitting: Surgery

## 2022-03-25 ENCOUNTER — Ambulatory Visit: Payer: 59 | Admitting: Surgery

## 2022-03-29 ENCOUNTER — Ambulatory Visit: Payer: Commercial Managed Care - HMO

## 2022-03-29 DIAGNOSIS — R252 Cramp and spasm: Secondary | ICD-10-CM

## 2022-03-29 DIAGNOSIS — R2689 Other abnormalities of gait and mobility: Secondary | ICD-10-CM

## 2022-03-29 DIAGNOSIS — R262 Difficulty in walking, not elsewhere classified: Secondary | ICD-10-CM

## 2022-03-29 DIAGNOSIS — M6281 Muscle weakness (generalized): Secondary | ICD-10-CM

## 2022-03-29 DIAGNOSIS — M5416 Radiculopathy, lumbar region: Secondary | ICD-10-CM | POA: Diagnosis not present

## 2022-03-29 NOTE — Therapy (Signed)
OUTPATIENT PHYSICAL THERAPY TREATMENT   Patient Name: Carol Ho MRN: [id number] DOB:25-Feb-1966, 56 y.o., female Today's Date: 03/29/2022   PT End of Session - 03/29/22 1531     Visit Number 3    Date for PT Re-Evaluation 04/26/22    Authorization Type [employer]    PT Start Time 1447    PT Stop Time 1528    PT Time Calculation (min) 41 min    Activity Tolerance Patient tolerated treatment well    Behavior During Therapy [facility] for tasks assessed/performed               Past Medical History:  Diagnosis Date   Hypertension    Thyroid disease    Past Surgical History:  Procedure Laterality Date   ELBOW SURGERY     TONSILLECTOMY     Patient Active Problem List   Diagnosis Date Noted   Tobacco use 02/15/2022   Chronic left-sided low back pain with left-sided sciatica 02/05/2022   Daytime somnolence 01/21/2022   Obstructive sleep apnea (adult) (pediatric) 01/21/2022   Bursitis of right shoulder 01/05/2021   Alcohol use 07/03/2015   Benign essential hypertension 07/03/2015   Discolored skin 07/03/2015   Elbow tendonitis 07/03/2015   Generalized anxiety disorder 07/03/2015   High risk medication use 07/03/2015   Hypothyroidism 07/03/2015   Neck pain 07/03/2015   Tremor of left hand 07/03/2015   Weakness of left upper extremity 07/03/2015    PCP: Charyl Dancer, NP  REFERRING PROVIDER: Lanae Crumbly, PA-C  REFERRING DIAG: [id number] (ICD-10-CM) - Other spondylosis with radiculopathy, lumbar region  RATIONALE FOR EVALUATION AND TREATMENT: Rehabilitation  THERAPY DIAG:  Radiculopathy, lumbar region  Cramp and spasm  Muscle weakness (generalized)  Other abnormalities of gait and mobility  Difficulty in walking, not elsewhere classified  ONSET DATE: 3+ months  NEXT MD VISIT:  03/25/22   SUBJECTIVE:                                                                                                                                                                                            SUBJECTIVE STATEMENT: Pt reports that the massage from last time helped a lot, she hadn't felt that good in 4 months.  PAIN:  Are you having pain? Yes: NPRS scale: 5/10 Pain location: L buttock, occasional shooting pain with numbness and tingling down L LE Pain description: sharp, stabbing, sometimes throbbing Aggravating factors: sit <> stand, car transfers, driving, prolonged stationary positioning in sitting or standing Relieving factors: stop what she is doing and start deep breathing  PERTINENT HISTORY: HTN, thyroid disease/hypothyroidism, neck pain, R shoulder bursitis, elbow tendinitis,  anxiety  PRECAUTIONS: None  WEIGHT BEARING RESTRICTIONS: No  FALLS:  Has patient fallen in last 6 months? 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 No acute abnormality.  PATIENT SURVEYS:  Modified Oswestry 25/50 = 50.0 % - severe disability  FOTO Lumbar = 42, predicted = 56  SCREENING FOR RED FLAGS: Bowel or bladder incontinence: No Spinal tumors: No Cauda equina syndrome: No Compression fracture: No Abdominal aneurysm: No  COGNITION:  Overall cognitive status: Within functional limits for tasks assessed     SENSATION: WFL Intermittent radicular pain, numbness and tingling down L LE  MUSCLE LENGTH: Hamstrings: mild tight L>R ITB: mod tight L>R Piriformis: mod tight L>R Hip flexors: mild tight L>R Quads: WFL Heelcord: NT  POSTURE:  No Significant postural limitations  PALPATION: TTP in L glutes and piriformis  LUMBAR ROM:   Active  AROM  Eval - 03/15/22  Flexion Hands to ankles   Extension 25% limited - lessens pain  Right lateral flexion Hand to lateral knee  Left lateral flexion Hand to femoral condyle  Right rotation WFL  Left rotation 25% limited   (Blank rows = not tested)  LOWER EXTREMITY ROM:     WFL  LOWER EXTREMITY MMT:    MMT Right eval Left eval  Hip flexion 4+ 4  Hip extension 4 4-  Hip abduction 4+ 4-  Hip adduction 4+ 4+  Hip internal rotation 4+ 4  Hip external rotation 4 4-  Knee flexion 5 4+  Knee extension 5 5  Ankle dorsiflexion 4+ 4+  Ankle plantarflexion    Ankle inversion    Ankle eversion     (Blank rows = not tested)  LUMBAR SPECIAL TESTS:  Straight leg raise test: Positive on L   TODAY'S TREATMENT: 03/29/22 TE: Nustep L4x48min Supine KTOS stretch 2x30 " L Supine figure 4 stretch 2x30" L Prone press up 2x15"  Manual Therapy: STM to L glutes and pirifromis  03/17/22 Therapeutic Exercise: Nustep L3x14min Prone press up x 30 sec Supine L piriformis figure 4 stretch 3x15"  Manual Therapy: STM to L glutes and TPR to piriformis  03/15/22 THERAPEUTIC EXERCISE: Instruction in initial HEP (see below) to improve flexibility, strength and mobility.  Verbal and tactile cues throughout for technique.    PATIENT EDUCATION:  Education details: self-STM techniques to glutes using lacrose ball Person educated: Patient Education method: Explanation, Demonstration, Verbal cues, and Handouts Education comprehension: verbalized understanding, returned demonstration, verbal cues required, and needs further education   HOME EXERCISE PROGRAM: Access Code: [email or web address] URL: [email or web address] Date: 03/15/2022 Prepared by: Annie Paras  Exercises - Hooklying Hamstring Stretch with Strap  - 2-3 x daily - 7 x weekly - 3 reps - 30 sec hold - Supine ITB Stretch with Strap  - 2-3 x daily - 7 x weekly - 3 reps - 30 sec hold - Supine Piriformis Stretch with Leg Straight  - 2-3 x daily - 7 x weekly - 3 reps - 30 sec  hold - Supine Figure 4 Piriformis Stretch  - 2-3 x daily - 7 x weekly - 3 reps - 30 sec hold - Supine Piriformis Stretch Pulling Heel to Hip  - 2-3 x  daily - 7 x weekly - 3 reps - 30 sec hold - Supine Lower Trunk Rotation  - 2-3 x daily - 7 x weekly - 5 reps - 10 sec hold  Patient Education - Trigger Point Dry Needling  ASSESSMENT:  CLINICAL IMPRESSION: Pt arrived with reports of improvement after MT last visit. She does not have radicular symptoms as often just occasionally. Continued with stretches and mobility to ease tightness in LBP and glutes along with MT. Good response to treatment and pt would benefit from continued focus on reducing muscle tension and lumbopelvic mobility.  OBJECTIVE IMPAIRMENTS: Abnormal gait, decreased activity tolerance, decreased endurance, decreased knowledge of condition, decreased mobility, difficulty walking, decreased ROM, decreased strength, increased fascial restrictions, impaired perceived functional ability, increased muscle spasms, impaired flexibility, improper body mechanics, postural dysfunction, and pain.   ACTIVITY LIMITATIONS: carrying, lifting, bending, sitting, standing, squatting, sleeping, stairs, transfers, bed mobility, bathing, and locomotion level  PARTICIPATION LIMITATIONS: meal prep, cleaning, laundry, driving, shopping, community activity, occupation, and yard work  PERSONAL FACTORS: Past/current experiences, Profession, Time since onset of injury/illness/exacerbation, and 3+ comorbidities: HTN, thyroid disease/hypothyroidism, neck pain, R shoulder bursitis, elbow tendinitis, anxiety  are also affecting patient's functional outcome.   REHAB POTENTIAL: Good  CLINICAL DECISION MAKING: Evolving/moderate complexity  EVALUATION COMPLEXITY: Moderate   GOALS: Goals reviewed with patient? Yes  SHORT TERM GOALS: Target date: 04/05/2022   Patient will be independent with initial HEP to improve outcomes and carryover.  Baseline:  Goal  status: IN PROGRESS  2.  Patient will report centralization of radicular symptoms.  Baseline:  Goal status: IN PROGRESS  LONG TERM GOALS: Target date: 04/26/2022    Patient will be independent with ongoing/advanced HEP for self-management at home.  Baseline:  Goal status: IN PROGRESS  2.  Patient will report 75% improvement in low back pain & L LE radicular pain to improve QOL.  Baseline:  Goal status: IN PROGRESS  3.  Patient to demonstrate ability to achieve and maintain good spinal alignment/posturing and body mechanics needed for daily activities. Baseline:  Goal status: IN PROGRESS  4.  Patient will demonstrate full pain free lumbar ROM to perform ADLs.   Baseline:  Goal status: IN PROGRESS  5.  Patient will demonstrate improved B LE strength to >/= 4+/5 for improved stability and ease of mobility . Baseline:  Goal status: IN PROGRESS  6.  Patient will report 29 on lumbar FOTO to demonstrate improved functional ability.  Baseline: Lumbar FOTO = 42 Goal status: IN PROGRESS   7.  Patient to report ability to perform ADLs, household, leisure and work-related tasks without increased pain. Baseline:  Goal status: IN PROGRESS   PLAN: PT FREQUENCY: 2x/week  PT DURATION: 6 weeks  PLANNED INTERVENTIONS: Therapeutic exercises, Therapeutic activity, Neuromuscular re-education, Balance training, Gait training, Patient/Family education, Self Care, Joint mobilization, Dry Needling, Electrical stimulation, Spinal mobilization, Cryotherapy, Moist heat, Taping, Traction, Ultrasound, Ionotophoresis 4mg /ml Dexamethasone, Manual therapy, and Re-evaluation.  PLAN FOR NEXT SESSION: Review initial HEP; progress lumbopelvic flexibility and strengthening with extension bias; MT +/- DN to address pain and abnormal muscle tension   Drury Ardizzone L Emiel Kielty, PTA 03/29/2022, 3:31 PM

## 2022-03-31 ENCOUNTER — Ambulatory Visit: Payer: Commercial Managed Care - HMO

## 2022-03-31 DIAGNOSIS — M5416 Radiculopathy, lumbar region: Secondary | ICD-10-CM | POA: Diagnosis not present

## 2022-03-31 DIAGNOSIS — R2689 Other abnormalities of gait and mobility: Secondary | ICD-10-CM

## 2022-03-31 DIAGNOSIS — R262 Difficulty in walking, not elsewhere classified: Secondary | ICD-10-CM

## 2022-03-31 DIAGNOSIS — M6281 Muscle weakness (generalized): Secondary | ICD-10-CM

## 2022-03-31 DIAGNOSIS — R252 Cramp and spasm: Secondary | ICD-10-CM

## 2022-03-31 NOTE — Therapy (Signed)
OUTPATIENT PHYSICAL THERAPY TREATMENT   Patient Name: Carol Ho MRN: [id number] DOB:March 26, 1966, 56 y.o., female Today's Date: 03/31/2022   PT End of Session - 03/31/22 1539     Visit Number 4    Date for PT Re-Evaluation 04/26/22    Authorization Type [employer]    PT Start Time 1452    PT Stop Time 8250    PT Time Calculation (min) 40 min    Activity Tolerance Patient tolerated treatment well    Behavior During Therapy [facility] for tasks assessed/performed                Past Medical History:  Diagnosis Date   Hypertension    Thyroid disease    Past Surgical History:  Procedure Laterality Date   ELBOW SURGERY     TONSILLECTOMY     Patient Active Problem List   Diagnosis Date Noted   Tobacco use 02/15/2022   Chronic left-sided low back pain with left-sided sciatica 02/05/2022   Daytime somnolence 01/21/2022   Obstructive sleep apnea (adult) (pediatric) 01/21/2022   Bursitis of right shoulder 01/05/2021   Alcohol use 07/03/2015   Benign essential hypertension 07/03/2015   Discolored skin 07/03/2015   Elbow tendonitis 07/03/2015   Generalized anxiety disorder 07/03/2015   High risk medication use 07/03/2015   Hypothyroidism 07/03/2015   Neck pain 07/03/2015   Tremor of left hand 07/03/2015   Weakness of left upper extremity 07/03/2015    PCP: Charyl Dancer, NP  REFERRING PROVIDER: Lanae Crumbly, PA-C  REFERRING DIAG: [id number] (ICD-10-CM) - Other spondylosis with radiculopathy, lumbar region  RATIONALE FOR EVALUATION AND TREATMENT: Rehabilitation  THERAPY DIAG:  Radiculopathy, lumbar region  Cramp and spasm  Muscle weakness (generalized)  Other abnormalities of gait and mobility  Difficulty in walking, not elsewhere classified  ONSET DATE: 3+ months  NEXT MD VISIT:  03/25/22   SUBJECTIVE:                                                                                                                                                                                            SUBJECTIVE STATEMENT: Pt reports that she feels good today.  PAIN:  Are you having pain? Yes: NPRS scale: 5/10 Pain location: L buttock, occasional shooting pain with numbness and tingling down L LE Pain description: sometime sharp, stabbing Aggravating factors: sit <> stand, car transfers, driving, prolonged stationary positioning in sitting or standing Relieving factors: stop what she is doing and start deep breathing  PERTINENT HISTORY: HTN, thyroid disease/hypothyroidism, neck pain, R shoulder bursitis, elbow tendinitis, anxiety  PRECAUTIONS: None  WEIGHT BEARING RESTRICTIONS: No  FALLS:  Has patient fallen in last 6 months? 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 No acute abnormality.  PATIENT SURVEYS:  Modified Oswestry 25/50 = 50.0 % - severe disability  FOTO Lumbar = 42, predicted = 56  SCREENING FOR RED FLAGS: Bowel or bladder incontinence: No Spinal tumors: No Cauda equina syndrome: No Compression fracture: No Abdominal aneurysm: No  COGNITION:  Overall cognitive status: Within functional limits for tasks assessed     SENSATION: WFL Intermittent radicular pain, numbness and tingling down L LE  MUSCLE LENGTH: Hamstrings: mild tight L>R ITB: mod tight L>R Piriformis: mod tight L>R Hip flexors: mild tight L>R Quads: WFL Heelcord: NT  POSTURE:  No Significant postural limitations  PALPATION: TTP in L glutes and piriformis  LUMBAR ROM:   Active  AROM  Eval - 03/15/22  Flexion Hands to ankles  Extension 25% limited - lessens pain  Right lateral flexion Hand to  lateral knee  Left lateral flexion Hand to femoral condyle  Right rotation WFL  Left rotation 25% limited   (Blank rows = not tested)  LOWER EXTREMITY ROM:     WFL  LOWER EXTREMITY MMT:    MMT Right eval Left eval  Hip flexion 4+ 4  Hip extension 4 4-  Hip abduction 4+ 4-  Hip adduction 4+ 4+  Hip internal rotation 4+ 4  Hip external rotation 4 4-  Knee flexion 5 4+  Knee extension 5 5  Ankle dorsiflexion 4+ 4+  Ankle plantarflexion    Ankle inversion    Ankle eversion     (Blank rows = not tested)  LUMBAR SPECIAL TESTS:  Straight leg raise test: Positive on L   TODAY'S TREATMENT: 03/31/22 Therapeutic Exercise: Nustep L4x42mn Supine march with TrA brace x 10 bil SLRs x 10 bil - fatigue in LLE after 10 reps Hooklying clam red TB with TrA brace x 10  S/L clam with red TB x 10 bil Supine hip ER stretch x 30 sec bil   Manual Therapy: STM to L glutes, piriformis  03/29/22 TE: Nustep L4x876m Supine KTOS stretch 2x30 " L Supine figure 4 stretch 2x30" L Prone press up 2x15"  Manual Therapy: STM to L glutes and pirifromis  03/17/22 Therapeutic Exercise: Nustep L3x6m61mProne press up x 30 sec Supine L piriformis figure 4 stretch 3x15"  Manual Therapy: STM to L glutes and TPR to piriformis  03/15/22 THERAPEUTIC EXERCISE: Instruction in initial HEP (see below) to improve flexibility, strength and mobility.  Verbal and tactile cues throughout for technique.    PATIENT EDUCATION:  Education details: HEP update - 03/31/22 Person educated: Patient Education method: Explanation, Demonstration, Verbal cues, and Handouts Education comprehension: verbalized understanding, returned demonstration, verbal cues required, and needs further education   HOME EXERCISE PROGRAM: Access Code: [email or web address]: [email or web address] Date: 03/31/2022 Prepared by: BraClarene Essexxercises - Hooklying Hamstring Stretch with Strap  - 2-3 x daily - 7 x weekly - 3  reps - 30 sec hold - Supine ITB Stretch with Strap  - 2-3 x daily - 7 x weekly - 3 reps - 30 sec hold - Supine Piriformis Stretch with Leg Straight  - 2-3 x daily -  7 x weekly - 3 reps - 30 sec hold - Supine Figure 4 Piriformis Stretch  - 2-3 x daily - 7 x weekly - 3 reps - 30 sec hold - Supine Piriformis Stretch Pulling Heel to Hip  - 2-3 x daily - 7 x weekly - 3 reps - 30 sec hold - Supine Lower Trunk Rotation  - 2-3 x daily - 7 x weekly - 5 reps - 10 sec hold - Supine March with Resistance Band  - 1 x daily - 7 x weekly - 3 sets - 10 reps - Clamshell with Resistance  - 1 x daily - 7 x weekly - 3 sets - 10 reps - Supine Active Straight Leg Raise  - 1 x daily - 7 x weekly - 3 sets - 10 reps  Patient Education - Trigger Point Dry Needling  ASSESSMENT:  CLINICAL IMPRESSION: Pt continues to demonstrated improved posture and gait. She still has some radicular symptoms but not as often as before. She is independent with HEP and we progressed to strengthening today for HEP. Worked on core stab with hip strengthening to improve lumbar stability. Pt was unable to complete a bridge w/o pain, had fatigue with SLRs but other exercises were well tolerated. Pt has met STG #1 and progressing toward STG #2.  OBJECTIVE IMPAIRMENTS: Abnormal gait, decreased activity tolerance, decreased endurance, decreased knowledge of condition, decreased mobility, difficulty walking, decreased ROM, decreased strength, increased fascial restrictions, impaired perceived functional ability, increased muscle spasms, impaired flexibility, improper body mechanics, postural dysfunction, and pain.   ACTIVITY LIMITATIONS: carrying, lifting, bending, sitting, standing, squatting, sleeping, stairs, transfers, bed mobility, bathing, and locomotion level  PARTICIPATION LIMITATIONS: meal prep, cleaning, laundry, driving, shopping, community activity, occupation, and yard work  PERSONAL FACTORS: Past/current experiences, Profession,  Time since onset of injury/illness/exacerbation, and 3+ comorbidities: HTN, thyroid disease/hypothyroidism, neck pain, R shoulder bursitis, elbow tendinitis, anxiety  are also affecting patient's functional outcome.   REHAB POTENTIAL: Good  CLINICAL DECISION MAKING: Evolving/moderate complexity  EVALUATION COMPLEXITY: Moderate   GOALS: Goals reviewed with patient? Yes  SHORT TERM GOALS: Target date: 04/05/2022   Patient will be independent with initial HEP to improve outcomes and carryover.  Baseline:  Goal status: MET- 03/31/22  2.  Patient will report centralization of radicular symptoms.  Baseline:  Goal status: IN PROGRESS - 03/31/22  LONG TERM GOALS: Target date: 04/26/2022    Patient will be independent with ongoing/advanced HEP for self-management at home.  Baseline:  Goal status: IN PROGRESS  2.  Patient will report 75% improvement in low back pain & L LE radicular pain to improve QOL.  Baseline:  Goal status: IN PROGRESS  3.  Patient to demonstrate ability to achieve and maintain good spinal alignment/posturing and body mechanics needed for daily activities. Baseline:  Goal status: IN PROGRESS  4.  Patient will demonstrate full pain free lumbar ROM to perform ADLs.   Baseline:  Goal status: IN PROGRESS  5.  Patient will demonstrate improved B LE strength to >/= 4+/5 for improved stability and ease of mobility . Baseline:  Goal status: IN PROGRESS  6.  Patient will report 33 on lumbar FOTO to demonstrate improved functional ability.  Baseline: Lumbar FOTO = 42 Goal status: IN PROGRESS   7.  Patient to report ability to perform ADLs, household, leisure and work-related tasks without increased pain. Baseline:  Goal status: IN PROGRESS   PLAN: PT FREQUENCY: 2x/week  PT DURATION: 6 weeks  PLANNED INTERVENTIONS: Therapeutic exercises, Therapeutic  activity, Neuromuscular re-education, Balance training, Gait training, Patient/Family education, Self Care, Joint  mobilization, Dry Needling, Electrical stimulation, Spinal mobilization, Cryotherapy, Moist heat, Taping, Traction, Ultrasound, Ionotophoresis 22m/ml Dexamethasone, Manual therapy, and Re-evaluation.  PLAN FOR NEXT SESSION:  progress lumbopelvic flexibility and strengthening with extension bias; MT +/- DN to address pain and abnormal muscle tension   Telia Amundson L Iretta Mangrum, PTA 03/31/2022, 3:40 PM

## 2022-04-01 ENCOUNTER — Encounter: Payer: Self-pay | Admitting: Surgery

## 2022-04-01 ENCOUNTER — Ambulatory Visit: Payer: Commercial Managed Care - HMO | Admitting: Surgery

## 2022-04-01 ENCOUNTER — Encounter: Payer: Commercial Managed Care - HMO | Admitting: Physical Therapy

## 2022-04-01 VITALS — BP 151/72 | HR 68 | Ht 60.0 in | Wt 145.0 lb

## 2022-04-01 DIAGNOSIS — M4726 Other spondylosis with radiculopathy, lumbar region: Secondary | ICD-10-CM | POA: Diagnosis not present

## 2022-04-01 NOTE — Progress Notes (Signed)
Office Visit Note   Patient: Carol Ho           Date of Birth: [birth year]           MRN: [id number] Visit Date: 04/01/2022              Requested by: Charyl Dancer, NP [place],  [place] [id number] PCP: Charyl Dancer, NP   Assessment & Plan: Visit Diagnoses:  1. Other spondylosis with radiculopathy, lumbar region     Plan: This patient states that she is making some improvement she like to continue PT.  Follow-up in 4 weeks with me for recheck but will call me 1 week before appointment.  If she has not had any concern improvement I will plan to order lumbar MRI to rule out HNP/stenosis.  All questions answered.  Follow-Up Instructions: Return in about 4 weeks (around 04/29/2022) for with [facility] recheck lumbar.   Orders:  No orders of the defined types were placed in this encounter.  No orders of the defined types were placed in this encounter.     Procedures: No procedures performed   Clinical Data: No additional findings.   Subjective: Chief Complaint  Patient presents with   Lower Back - Follow-up    HPI 56 year old female returns for recheck of her lumbar spine issues.  States that since her last visit and having medication management and formal PT she has about 60 to 70% better.  She can walk and sit more comfortably.  She is now using ibuprofen. Review of Systems No current complaints of cardiopulmonary GI/GU issues  Objective: Vital Signs: BP (!) 151/72   Pulse 68   Ht 5' (1.524 m)   Wt 145 lb (65.8 kg)   BMI 28.32 kg/m   Physical Exam Constitutional:      Appearance: Normal appearance.  Eyes:     Extraocular Movements: Extraocular movements intact.  Musculoskeletal:     Comments: Gait is normal.  Patient has some lumbar paraspinal tenderness.  Negative logroll.  Negative straight leg raise.  No focal deficits.  Neurological:     Mental Status: She is alert and oriented to person, place, and time.  Psychiatric:         Mood and Affect: Mood normal.     Ortho Exam  Specialty Comments:  No specialty comments available.  Imaging: No results found.   PMFS History: Patient Active Problem List   Diagnosis Date Noted   Tobacco use 02/15/2022   Chronic left-sided low back pain with left-sided sciatica 02/05/2022   Daytime somnolence 01/21/2022   Obstructive sleep apnea (adult) (pediatric) 01/21/2022   Bursitis of right shoulder 01/05/2021   Alcohol use 07/03/2015   Benign essential hypertension 07/03/2015   Discolored skin 07/03/2015   Elbow tendonitis 07/03/2015   Generalized anxiety disorder 07/03/2015   High risk medication use 07/03/2015   Hypothyroidism 07/03/2015   Neck pain 07/03/2015   Tremor of left hand 07/03/2015   Weakness of left upper extremity 07/03/2015   Past Medical History:  Diagnosis Date   Hypertension    Thyroid disease     Family History  Problem Relation Age of Onset   Hypertension Mother    Healthy Father    Healthy Brother     Past Surgical History:  Procedure Laterality Date   ELBOW SURGERY     TONSILLECTOMY     Social History   Occupational History   Not on file  Tobacco Use   Smoking  status: Every Day    Packs/day: 0.75    Years: 20.00    Total pack years: 15.00    Types: Cigarettes   Smokeless tobacco: Never  Vaping Use   Vaping Use: Never used  Substance and Sexual Activity   Alcohol use: Yes    Alcohol/week: 12.0 standard drinks of alcohol    Types: 12 Cans of beer per week   Drug use: Never   Sexual activity: Yes    Birth control/protection: Post-menopausal

## 2022-04-05 ENCOUNTER — Encounter: Payer: Self-pay | Admitting: Physical Therapy

## 2022-04-05 ENCOUNTER — Ambulatory Visit: Payer: Commercial Managed Care - HMO | Attending: Surgery | Admitting: Physical Therapy

## 2022-04-05 DIAGNOSIS — M6281 Muscle weakness (generalized): Secondary | ICD-10-CM | POA: Diagnosis present

## 2022-04-05 DIAGNOSIS — M5416 Radiculopathy, lumbar region: Secondary | ICD-10-CM | POA: Diagnosis not present

## 2022-04-05 DIAGNOSIS — R2689 Other abnormalities of gait and mobility: Secondary | ICD-10-CM | POA: Diagnosis present

## 2022-04-05 DIAGNOSIS — R252 Cramp and spasm: Secondary | ICD-10-CM | POA: Diagnosis present

## 2022-04-05 DIAGNOSIS — R262 Difficulty in walking, not elsewhere classified: Secondary | ICD-10-CM | POA: Insufficient documentation

## 2022-04-05 NOTE — Therapy (Signed)
OUTPATIENT PHYSICAL THERAPY TREATMENT   Patient Name: Carol Ho MRN: [id number] DOB:May 08, 1966, 56 y.o., female Today's Date: 04/05/2022   PT End of Session - 04/05/22 1401     Visit Number 5    Date for PT Re-Evaluation 04/26/22    Authorization Type [employer]    PT Start Time 1401    PT Stop Time 8502    PT Time Calculation (min) 42 min    Activity Tolerance Patient tolerated treatment well    Behavior During Therapy [facility] for tasks assessed/performed                 Past Medical History:  Diagnosis Date   Hypertension    Thyroid disease    Past Surgical History:  Procedure Laterality Date   ELBOW SURGERY     TONSILLECTOMY     Patient Active Problem List   Diagnosis Date Noted   Tobacco use 02/15/2022   Chronic left-sided low back pain with left-sided sciatica 02/05/2022   Daytime somnolence 01/21/2022   Obstructive sleep apnea (adult) (pediatric) 01/21/2022   Bursitis of right shoulder 01/05/2021   Alcohol use 07/03/2015   Benign essential hypertension 07/03/2015   Discolored skin 07/03/2015   Elbow tendonitis 07/03/2015   Generalized anxiety disorder 07/03/2015   High risk medication use 07/03/2015   Hypothyroidism 07/03/2015   Neck pain 07/03/2015   Tremor of left hand 07/03/2015   Weakness of left upper extremity 07/03/2015    PCP: Charyl Dancer, NP  REFERRING PROVIDER: Lanae Crumbly, PA-C  REFERRING DIAG: [phone] (ICD-10-CM) - Other spondylosis with radiculopathy, lumbar region  RATIONALE FOR EVALUATION AND TREATMENT: Rehabilitation  THERAPY DIAG:  Radiculopathy, lumbar region  Cramp and spasm  Muscle weakness (generalized)  Other abnormalities of gait and mobility  Difficulty in walking, not elsewhere classified  ONSET DATE: 3+ months  NEXT MD VISIT:  04/29/22   SUBJECTIVE:                                                                                                                                                                                            SUBJECTIVE STATEMENT: Pt reports she has been able to sit more comfortably at home as well as getting in/out of bed with less pain.  PAIN:  Are you having pain? Yes: NPRS scale: 4-5/10 Pain location: L buttock, occasional shooting pain with numbness and tingling down L LE to thigh Pain description: sometime sharp, stabbing Aggravating factors: prolonged stationary positioning in sitting Relieving factors: stop what she is doing and start deep breathing  PERTINENT HISTORY: HTN, thyroid disease/hypothyroidism, neck pain, R shoulder bursitis, elbow tendinitis, anxiety  PRECAUTIONS: None  WEIGHT BEARING RESTRICTIONS: No  FALLS:  Has patient fallen in last 6 months? 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 No acute abnormality.  PATIENT SURVEYS:  Modified Oswestry 25/50 = 50.0 % - severe disability  FOTO Lumbar = 42, predicted = 56  SCREENING FOR RED FLAGS: Bowel or bladder incontinence: No Spinal tumors: No Cauda equina syndrome: No Compression fracture: No Abdominal aneurysm: No  COGNITION:  Overall cognitive status: Within functional limits for tasks assessed     SENSATION: WFL Intermittent radicular pain, numbness and tingling down L LE  MUSCLE LENGTH: Hamstrings: mild tight L>R ITB: mod tight L>R Piriformis: mod tight L>R Hip flexors: mild tight L>R Quads: WFL Heelcord: NT  POSTURE:  No Significant postural limitations  PALPATION: TTP in L glutes and piriformis  LUMBAR ROM:   Active  AROM  Eval - 03/15/22  Flexion Hands to ankles  Extension 25% limited - lessens  pain  Right lateral flexion Hand to lateral knee  Left lateral flexion Hand to femoral condyle  Right rotation WFL  Left rotation 25% limited   (Blank rows = not tested)  LOWER EXTREMITY ROM:     WFL  LOWER EXTREMITY MMT:    MMT Right eval Left eval  Hip flexion 4+ 4  Hip extension 4 4-  Hip abduction 4+ 4-  Hip adduction 4+ 4+  Hip internal rotation 4+ 4  Hip external rotation 4 4-  Knee flexion 5 4+  Knee extension 5 5  Ankle dorsiflexion 4+ 4+  Ankle plantarflexion    Ankle inversion    Ankle eversion     (Blank rows = not tested)  LUMBAR SPECIAL TESTS:  Straight leg raise test: Positive on L   TODAY'S TREATMENT:  04/05/22 THERAPEUTIC EXERCISE: to improve flexibility, strength and mobility.  Verbal and tactile cues throughout for technique. NuStep - L5 x 7 min (UE/LE) L SKTC stretch 2 x 30" L/R heel to hip figure-4 piriformis stretch 2 x 30" L/R figure-4 to chest piriformis stretch 2 x 30"  SELF CARE: Provided education in proper posture and body mechanics for typical daily positioning and household chores to minimize strain on low back.  MANUAL THERAPY: To promote normalized muscle tension, improved flexibility, and reduced pain. Skilled palpation and monitoring of soft tissue during DN Trigger Point Dry-Needling  Treatment instructions: Expect mild to moderate muscle soreness. Patient verbalized understanding of these instructions and education. Patient Consent Given: Yes Education handout provided: Previously provided Muscles treated: L glute medius/minimus & piriformis Electrical stimulation performed: No Parameters: N/A Treatment response/outcome: Twitch Response Elicited and Palpable Increase in Muscle Length STM/DTM, manual TPR and pin & stretch to muscles addressed with DN   03/31/22 Therapeutic Exercise: Nustep L4x34mn Supine march with TrA brace x 10 bil SLRs x 10 bil - fatigue in LLE after 10 reps Hooklying clam red TB with TrA brace x 10   S/L clam with red TB x 10 bil Supine hip ER stretch x 30 sec bil  Manual Therapy: STM to L glutes, piriformis   03/29/22 TE: Nustep L4x898m Supine KTOS stretch 2x30 " L Supine figure 4 stretch 2x30" L Prone press up 2x15"  Manual Therapy: STM to L glutes and pirifromis   PATIENT EDUCATION:  Education details: posture and body mechanics for typical daily postioning, mobility and household tasks, role of DN, and DN rational, procedure, outcomes, potential side effects, and recommended post-treatment exercises/activity Person educated: Patient Education method: Explanation, Demonstration, Verbal cues, and Handouts Education comprehension: verbalized understanding, returned demonstration, verbal cues required, and needs further education   HOME EXERCISE PROGRAM: Access Code: [email or web address] URL: [email or web address] Date: 04/05/2022 Prepared by: Annie Paras  Exercises - Hooklying Hamstring Stretch with Strap  - 2-3 x daily - 7 x weekly - 3 reps - 30 sec hold - Supine ITB Stretch with Strap  - 2-3 x daily - 7 x weekly - 3 reps - 30 sec hold - Supine Piriformis Stretch with Leg Straight  - 2-3 x daily - 7 x weekly - 3 reps - 30 sec hold - Supine Figure 4 Piriformis Stretch  - 2-3 x daily - 7 x weekly - 3 reps - 30 sec hold - Supine Piriformis Stretch Pulling Heel to Hip  - 2-3 x daily - 7 x weekly - 3 reps - 30 sec hold - Supine Lower Trunk Rotation  - 2-3 x daily - 7 x weekly - 5 reps - 10 sec hold - Supine March with Resistance Band  - 1 x daily - 7 x weekly - 3 sets - 10 reps - Clamshell with Resistance  - 1 x daily - 7 x weekly - 3 sets - 10 reps - Supine Active Straight Leg Raise  - 1 x daily - 7 x weekly - 3 sets - 10 reps  Patient Education - Trigger Point Dry Needling - Posture and Body Mechanics  ASSESSMENT:  CLINICAL IMPRESSION: Shantasia reports her pain has been improving with radicular pain now typically only extending to her thigh rather than down her  whole leg - STG #2 met. She continues to note feeling of muscle "grabbing"/spasms in her L buttock at times with changes of position including rising from the chair in the waiting room. Education provided for proper posture and body mechanics for typical daily positioning and household chores to minimize strain on low back as she notes increased pain with activities such as carrying a bucket on one side. Increased muscle tension identified in her L glutes and piriformis which appeared amenable to DN. After explanation of DN rational, procedures, outcomes and potential side effects, patient verbalized consent to DN treatment in conjunction with manual STM/DTM and TPR to reduce ttp/muscle tension. Muscles treated as indicated above. DN produced normal response with good twitches elicited resulting in palpable reduction in pain/ttp and muscle tension, with pt noting no pain on transition supine to sit and sit to stand following DN (movements that are often triggers for her pain). Pt educated to expect mild to moderate muscle soreness for up to 24-48 hrs and instructed to continue prescribed home exercise program and current activity level with pt verbalizing understanding of theses instructions.   OBJECTIVE IMPAIRMENTS: Abnormal gait, decreased activity tolerance, decreased endurance, decreased knowledge of condition, decreased mobility, difficulty walking, decreased ROM, decreased strength, increased fascial restrictions, impaired perceived functional ability, increased muscle spasms, impaired flexibility, improper body mechanics, postural dysfunction, and pain.   ACTIVITY LIMITATIONS: carrying, lifting, bending, sitting, standing, squatting, sleeping, stairs, transfers, bed mobility, bathing, and locomotion level  PARTICIPATION LIMITATIONS: meal prep, cleaning, laundry, driving, shopping, community activity, occupation, and yard work  PERSONAL FACTORS: Past/current experiences, Profession, Time since onset of  injury/illness/exacerbation, and 3+  comorbidities: HTN, thyroid disease/hypothyroidism, neck pain, R shoulder bursitis, elbow tendinitis, anxiety  are also affecting patient's functional outcome.   REHAB POTENTIAL: Good  CLINICAL DECISION MAKING: Evolving/moderate complexity  EVALUATION COMPLEXITY: Moderate   GOALS: Goals reviewed with patient? Yes  SHORT TERM GOALS: Target date: 04/05/2022   Patient will be independent with initial HEP to improve outcomes and carryover.  Baseline:  Goal status: MET - 03/31/22  2.  Patient will report centralization of radicular symptoms.  Baseline:  Goal status: IN PROGRESS - 04/05/22 - Pt reports pain now extending only into thigh rather than all the way down her leg  LONG TERM GOALS: Target date: 04/26/2022    Patient will be independent with ongoing/advanced HEP for self-management at home.  Baseline:  Goal status: IN PROGRESS  2.  Patient will report 75% improvement in low back pain & L LE radicular pain to improve QOL.  Baseline:  Goal status: IN PROGRESS  3.  Patient to demonstrate ability to achieve and maintain good spinal alignment/posturing and body mechanics needed for daily activities. Baseline:  Goal status: IN PROGRESS  4.  Patient will demonstrate full pain free lumbar ROM to perform ADLs.   Baseline:  Goal status: IN PROGRESS  5.  Patient will demonstrate improved B LE strength to >/= 4+/5 for improved stability and ease of mobility . Baseline:  Goal status: IN PROGRESS  6.  Patient will report 14 on lumbar FOTO to demonstrate improved functional ability.  Baseline: Lumbar FOTO = 42 Goal status: IN PROGRESS   7.  Patient to report ability to perform ADLs, household, leisure and work-related tasks without increased pain. Baseline:  Goal status: IN PROGRESS   PLAN: PT FREQUENCY: 2x/week  PT DURATION: 6 weeks  PLANNED INTERVENTIONS: Therapeutic exercises, Therapeutic activity, Neuromuscular re-education, Balance  training, Gait training, Patient/Family education, Self Care, Joint mobilization, Dry Needling, Electrical stimulation, Spinal mobilization, Cryotherapy, Moist heat, Taping, Traction, Ultrasound, Ionotophoresis 9m/ml Dexamethasone, Manual therapy, and Re-evaluation.  PLAN FOR NEXT SESSION:  Assess response to DN; progress lumbopelvic flexibility and strengthening with extension bias; MT +/- DN to address pain and abnormal muscle tension   JPercival Spanish PT 04/05/2022, 5:50 PM

## 2022-04-12 ENCOUNTER — Encounter: Payer: Self-pay | Admitting: Physical Therapy

## 2022-04-12 ENCOUNTER — Ambulatory Visit: Payer: Commercial Managed Care - HMO | Admitting: Physical Therapy

## 2022-04-12 DIAGNOSIS — M4726 Other spondylosis with radiculopathy, lumbar region: Secondary | ICD-10-CM

## 2022-04-12 DIAGNOSIS — R252 Cramp and spasm: Secondary | ICD-10-CM

## 2022-04-12 DIAGNOSIS — M5416 Radiculopathy, lumbar region: Secondary | ICD-10-CM

## 2022-04-12 DIAGNOSIS — R262 Difficulty in walking, not elsewhere classified: Secondary | ICD-10-CM

## 2022-04-12 DIAGNOSIS — M6281 Muscle weakness (generalized): Secondary | ICD-10-CM

## 2022-04-12 DIAGNOSIS — R2689 Other abnormalities of gait and mobility: Secondary | ICD-10-CM

## 2022-04-12 NOTE — Telephone Encounter (Signed)
Per Jeneen Rinks- OK to order MRI and have patient follow up with Dr. Lorin Mercy to review.  Order entered.

## 2022-04-12 NOTE — Therapy (Signed)
OUTPATIENT PHYSICAL THERAPY TREATMENT   Patient Name: Carol Ho MRN: [id number] DOB:11-24-1965, 56 y.o., female Today's Date: 04/12/2022   PT End of Session - 04/12/22 1446     Visit Number 6    Date for PT Re-Evaluation 04/26/22    Authorization Type [employer]    PT Start Time 1446    PT Stop Time 5176    PT Time Calculation (min) 41 min    Activity Tolerance Patient tolerated treatment well    Behavior During Therapy [facility] for tasks assessed/performed                 Past Medical History:  Diagnosis Date   Hypertension    Thyroid disease    Past Surgical History:  Procedure Laterality Date   ELBOW SURGERY     TONSILLECTOMY     Patient Active Problem List   Diagnosis Date Noted   Tobacco use 02/15/2022   Chronic left-sided low back pain with left-sided sciatica 02/05/2022   Daytime somnolence 01/21/2022   Obstructive sleep apnea (adult) (pediatric) 01/21/2022   Bursitis of right shoulder 01/05/2021   Alcohol use 07/03/2015   Benign essential hypertension 07/03/2015   Discolored skin 07/03/2015   Elbow tendonitis 07/03/2015   Generalized anxiety disorder 07/03/2015   High risk medication use 07/03/2015   Hypothyroidism 07/03/2015   Neck pain 07/03/2015   Tremor of left hand 07/03/2015   Weakness of left upper extremity 07/03/2015    PCP: Charyl Dancer, NP  REFERRING PROVIDER: Lanae Crumbly, PA-C  REFERRING DIAG: [phone] (ICD-10-CM) - Other spondylosis with radiculopathy, lumbar region  RATIONALE FOR EVALUATION AND TREATMENT: Rehabilitation  THERAPY DIAG:  Radiculopathy, lumbar region  Cramp and spasm  Muscle weakness (generalized)  Other abnormalities of gait and mobility  Difficulty in walking, not elsewhere classified  ONSET DATE: 3+ months  NEXT MD VISIT:  04/29/22   SUBJECTIVE:                                                                                                                                                                                            SUBJECTIVE STATEMENT: Pt reports the DN helped a lot. She was able to go kayaking this weekend w/o any issues with her back/buttock.  PAIN:  Are you having pain? Yes: NPRS scale: 4-5/10 Pain location: L buttock, shooting pain with numbness and tingling down L LE to thigh still occasionally present but less common Pain description: sometime sharp, stabbing Aggravating factors: prolonged stationary positioning in sitting Relieving factors: stop what she is doing and start deep breathing  PERTINENT HISTORY: HTN, thyroid disease/hypothyroidism, neck pain, R shoulder bursitis,  elbow tendinitis, anxiety  PRECAUTIONS: None  WEIGHT BEARING RESTRICTIONS: No  FALLS:  Has patient fallen in last 6 months? 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 No acute abnormality.  PATIENT SURVEYS:  Modified Oswestry 25/50 = 50.0 % - severe disability  FOTO Lumbar = 42, predicted = 56  SCREENING FOR RED FLAGS: Bowel or bladder incontinence: No Spinal tumors: No Cauda equina syndrome: No Compression fracture: No Abdominal aneurysm: No  COGNITION:  Overall cognitive status: Within functional limits for tasks assessed     SENSATION: WFL Intermittent radicular pain, numbness and tingling down L LE  MUSCLE LENGTH: Hamstrings: mild tight L>R ITB: mod tight L>R Piriformis: mod tight L>R Hip flexors: mild tight L>R Quads: WFL Heelcord: NT  POSTURE:  No Significant postural limitations  PALPATION: TTP in L glutes and piriformis  LUMBAR ROM:   Active  AROM  Eval - 03/15/22  Flexion Hands to ankles   Extension 25% limited - lessens pain  Right lateral flexion Hand to lateral knee  Left lateral flexion Hand to femoral condyle  Right rotation WFL  Left rotation 25% limited   (Blank rows = not tested)  LOWER EXTREMITY ROM:     WFL  LOWER EXTREMITY MMT:    MMT Right eval Left eval  Hip flexion 4+ 4  Hip extension 4 4-  Hip abduction 4+ 4-  Hip adduction 4+ 4+  Hip internal rotation 4+ 4  Hip external rotation 4 4-  Knee flexion 5 4+  Knee extension 5 5  Ankle dorsiflexion 4+ 4+  Ankle plantarflexion    Ankle inversion    Ankle eversion     (Blank rows = not tested)  LUMBAR SPECIAL TESTS:  Straight leg raise test: Positive on L   TODAY'S TREATMENT:  04/12/22 THERAPEUTIC EXERCISE: to improve flexibility, strength and mobility.  Verbal and tactile cues throughout for technique. NuStep - L6 x 7 min (UE/LE) L figure-4 piriformis stretch with overpressure 2 x 30" L heel to hip figure-4 piriformis stretch 2 x 30" L/R figure-4 to chest piriformis stretch 2 x 30" - towel for added reach Hooklying TrA + GTB clam 10 x 3" Bridge + GTB clam 10 x 3"  MANUAL THERAPY: To promote normalized muscle tension, improved flexibility, and reduced pain. Skilled palpation and monitoring of soft tissue during DN Trigger Point Dry-Needling  Treatment instructions: Expect mild to moderate muscle soreness. S/S of pneumothorax if dry needled over a lung field, and to seek immediate medical attention should they occur. Patient verbalized understanding of these instructions and education. Patient Consent Given: Yes Education handout provided: Previously provided Muscles treated: L glute medius/minimus & medial/lateral piriformis Electrical stimulation performed: No Parameters: N/A Treatment response/outcome: Twitch Response Elicited and Palpable Increase in Muscle Length STM/DTM, manual TPR and pin & stretch to muscles addressed with DN   04/05/22 THERAPEUTIC EXERCISE: to improve  flexibility, strength and mobility.  Verbal and tactile cues throughout for technique. NuStep - L5 x 7 min (UE/LE) L SKTC stretch 2 x 30" L/R heel to hip figure-4 piriformis stretch 2 x 30" L/R figure-4 to chest piriformis stretch 2 x 30"  SELF CARE: Provided education in proper posture   and body mechanics for typical daily positioning and household chores to minimize strain on low back.  MANUAL THERAPY: To promote normalized muscle tension, improved flexibility, and reduced pain. Skilled palpation and monitoring of soft tissue during DN Trigger Point Dry-Needling  Treatment instructions: Expect mild to moderate muscle soreness. Patient verbalized understanding of these instructions and education. Patient Consent Given: Yes Education handout provided: Previously provided Muscles treated: L glute medius/minimus & piriformis Electrical stimulation performed: No Parameters: N/A Treatment response/outcome: Twitch Response Elicited and Palpable Increase in Muscle Length STM/DTM, manual TPR and pin & stretch to muscles addressed with DN   03/31/22 Therapeutic Exercise: Nustep [id number] Supine march with TrA brace x 10 bil SLRs x 10 bil - fatigue in LLE after 10 reps Hooklying clam red TB with TrA brace x 10  S/L clam with red TB x 10 bil Supine hip ER stretch x 30 sec bil  Manual Therapy: STM to L glutes, piriformis    PATIENT EDUCATION:  Education details: posture and body mechanics for typical daily postioning, mobility and household tasks, role of DN, and DN rational, procedure, outcomes, potential side effects, and recommended post-treatment exercises/activity Person educated: Patient Education method: Explanation, Demonstration, Verbal cues, and Handouts Education comprehension: verbalized understanding, returned demonstration, verbal cues required, and needs further education   HOME EXERCISE PROGRAM: Access Code: [email or web address]: [email or web address] Date:  04/05/2022 Prepared by: JAnnie Paras Exercises - Hooklying Hamstring Stretch with Strap  - 2-3 x daily - 7 x weekly - 3 reps - 30 sec hold - Supine ITB Stretch with Strap  - 2-3 x daily - 7 x weekly - 3 reps - 30 sec hold - Supine Piriformis Stretch with Leg Straight  - 2-3 x daily - 7 x weekly - 3 reps - 30 sec hold - Supine Figure 4 Piriformis Stretch  - 2-3 x daily - 7 x weekly - 3 reps - 30 sec hold - Supine Piriformis Stretch Pulling Heel to Hip  - 2-3 x daily - 7 x weekly - 3 reps - 30 sec hold - Supine Lower Trunk Rotation  - 2-3 x daily - 7 x weekly - 5 reps - 10 sec hold - Supine March with Resistance Band  - 1 x daily - 7 x weekly - 3 sets - 10 reps - Clamshell with Resistance  - 1 x daily - 7 x weekly - 3 sets - 10 reps - Supine Active Straight Leg Raise  - 1 x daily - 7 x weekly - 3 sets - 10 reps  Patient Education - Trigger Point Dry Needling - Posture and Body Mechanics  ASSESSMENT:  CLINICAL IMPRESSION: JTylinreports she is noting 60% improvement since start of PT with good benefit noted from DN. She continues to note increased tension mostly in piriformis with a little soreness in lateral glutes - addressed these areas with DN incorporating DN with good twitch responses elicited. Pt reporting better tolerance for stretches and no pain with bridging exercise (which had previously been painful) following DN. JDaniylawill continue to benefit from skilled PT to address remaining deficits and allow for improved mobility and activity tolerance with decreased pain interference.  OBJECTIVE IMPAIRMENTS: Abnormal gait, decreased activity tolerance, decreased endurance, decreased knowledge of condition, decreased mobility, difficulty walking, decreased ROM, decreased strength, increased fascial restrictions, impaired perceived functional ability, increased muscle spasms, impaired flexibility, improper body mechanics, postural dysfunction, and pain.   ACTIVITY LIMITATIONS:  carrying, lifting, bending, sitting, standing, squatting, sleeping, stairs, transfers, bed mobility,  bathing, and locomotion level  PARTICIPATION LIMITATIONS: meal prep, cleaning, laundry, driving, shopping, community activity, occupation, and yard work  PERSONAL FACTORS: Past/current experiences, Profession, Time since onset of injury/illness/exacerbation, and 3+ comorbidities: HTN, thyroid disease/hypothyroidism, neck pain, R shoulder bursitis, elbow tendinitis, anxiety  are also affecting patient's functional outcome.   REHAB POTENTIAL: Good  CLINICAL DECISION MAKING: Evolving/moderate complexity  EVALUATION COMPLEXITY: Moderate   GOALS: Goals reviewed with patient? Yes  SHORT TERM GOALS: Target date: 04/05/2022   Patient will be independent with initial HEP to improve outcomes and carryover.  Baseline:  Goal status: MET - 03/31/22  2.  Patient will report centralization of radicular symptoms.  Baseline:  Goal status: IN PROGRESS - 04/12/22 - Pt reports radicular pain less common but when present, now extending only into thigh/calf rather than all the way down her leg  LONG TERM GOALS: Target date: 04/26/2022    Patient will be independent with ongoing/advanced HEP for self-management at home.  Baseline:  Goal status: IN PROGRESS  2.  Patient will report 75% improvement in low back pain & L LE radicular pain to improve QOL.  Baseline:  Goal status: IN PROGRESS  04/12/22 - Pt reports 60% improvement  3.  Patient to demonstrate ability to achieve and maintain good spinal alignment/posturing and body mechanics needed for daily activities. Baseline:  Goal status: IN PROGRESS  04/12/22 - Pt reports good understanding of posture and body mechanics  4.  Patient will demonstrate full pain free lumbar ROM to perform ADLs.   Baseline:  Goal status: IN PROGRESS  5.  Patient will demonstrate improved B LE strength to >/= 4+/5 for improved stability and ease of mobility . Baseline:   Goal status: IN PROGRESS  6.  Patient will report 56 on lumbar FOTO to demonstrate improved functional ability.  Baseline: Lumbar FOTO = 42 Goal status: IN PROGRESS   7.  Patient to report ability to perform ADLs, household, leisure and work-related tasks without increased pain. Baseline:  Goal status: IN PROGRESS   PLAN: PT FREQUENCY: 2x/week  PT DURATION: 6 weeks  PLANNED INTERVENTIONS: Therapeutic exercises, Therapeutic activity, Neuromuscular re-education, Balance training, Gait training, Patient/Family education, Self Care, Joint mobilization, Dry Needling, Electrical stimulation, Spinal mobilization, Cryotherapy, Moist heat, Taping, Traction, Ultrasound, Ionotophoresis 4mg/ml Dexamethasone, Manual therapy, and Re-evaluation.  PLAN FOR NEXT SESSION:  Assess response to DN; progress lumbopelvic flexibility and strengthening with extension bias; MT +/- DN to address pain and abnormal muscle tension   JoAnne M Kreis, PT 04/12/2022, 3:33 PM 

## 2022-04-12 NOTE — Telephone Encounter (Signed)
FYI

## 2022-04-15 ENCOUNTER — Ambulatory Visit: Payer: Commercial Managed Care - HMO | Admitting: Physical Therapy

## 2022-04-15 ENCOUNTER — Encounter: Payer: Self-pay | Admitting: Physical Therapy

## 2022-04-15 DIAGNOSIS — M6281 Muscle weakness (generalized): Secondary | ICD-10-CM

## 2022-04-15 DIAGNOSIS — R2689 Other abnormalities of gait and mobility: Secondary | ICD-10-CM

## 2022-04-15 DIAGNOSIS — M5416 Radiculopathy, lumbar region: Secondary | ICD-10-CM

## 2022-04-15 DIAGNOSIS — R262 Difficulty in walking, not elsewhere classified: Secondary | ICD-10-CM

## 2022-04-15 DIAGNOSIS — R252 Cramp and spasm: Secondary | ICD-10-CM

## 2022-04-15 NOTE — Therapy (Signed)
OUTPATIENT PHYSICAL THERAPY TREATMENT   Patient Name: Carol Ho MRN: [id number] DOB:[birth year], 56 y.o., female Today's Date: 04/15/2022   PT End of Session - 04/15/22 1402     Visit Number 7    Date for PT Re-Evaluation 04/26/22    Authorization Type [employer]    PT Start Time 1402    PT Stop Time 1455    PT Time Calculation (min) 53 min    Activity Tolerance Patient tolerated treatment well    Behavior During Therapy [facility] for tasks assessed/performed                  Past Medical History:  Diagnosis Date   Hypertension    Thyroid disease    Past Surgical History:  Procedure Laterality Date   ELBOW SURGERY     TONSILLECTOMY     Patient Active Problem List   Diagnosis Date Noted   Tobacco use 02/15/2022   Chronic left-sided low back pain with left-sided sciatica 02/05/2022   Daytime somnolence 01/21/2022   Obstructive sleep apnea (adult) (pediatric) 01/21/2022   Bursitis of right shoulder 01/05/2021   Alcohol use 07/03/2015   Benign essential hypertension 07/03/2015   Discolored skin 07/03/2015   Elbow tendonitis 07/03/2015   Generalized anxiety disorder 07/03/2015   High risk medication use 07/03/2015   Hypothyroidism 07/03/2015   Neck pain 07/03/2015   Tremor of left hand 07/03/2015   Weakness of left upper extremity 07/03/2015    PCP: Charyl Dancer, NP  REFERRING PROVIDER: Lanae Crumbly, PA-C  REFERRING DIAG: [phone] (ICD-10-CM) - Other spondylosis with radiculopathy, lumbar region  RATIONALE FOR EVALUATION AND TREATMENT: Rehabilitation  THERAPY DIAG:  Radiculopathy, lumbar region  Cramp and spasm  Muscle weakness (generalized)  Other abnormalities of gait and mobility  Difficulty in walking, not elsewhere classified  ONSET DATE: 3+ months  NEXT MD VISIT:  04/29/22   SUBJECTIVE:                                                                                                                                                                                            SUBJECTIVE STATEMENT: Pt she did not sleep well last night and feels over-tired today. She states she was not able to get to her stretches yesterday but states HEP going well.  PAIN:  Are you having pain? Yes: NPRS scale: 6/10 Pain location: L buttock & L low back, shooting pain with numbness and tingling down L LE to thigh still occasionally present but less common Pain description: sometime sharp, stabbing Aggravating factors: prolonged stationary positioning in sitting Relieving factors: stop what she is doing and start  deep breathing  PERTINENT HISTORY: HTN, thyroid disease/hypothyroidism, neck pain, R shoulder bursitis, elbow tendinitis, anxiety  PRECAUTIONS: None  WEIGHT BEARING RESTRICTIONS: No  FALLS:  Has patient fallen in last 6 months? 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 No acute abnormality.  PATIENT SURVEYS:  Modified Oswestry 25/50 = 50.0 % - severe disability  FOTO Lumbar = 42, predicted = 56  SCREENING FOR RED FLAGS: Bowel or bladder incontinence: No Spinal tumors: No Cauda equina syndrome: No Compression fracture: No Abdominal aneurysm: No  COGNITION:  Overall cognitive status: Within functional limits for tasks assessed     SENSATION: WFL Intermittent radicular pain, numbness and tingling down L LE  MUSCLE LENGTH: Hamstrings: mild tight L>R ITB: mod tight L>R Piriformis: mod tight L>R Hip flexors: mild tight L>R Quads: WFL Heelcord: NT  POSTURE:  No Significant postural limitations  PALPATION: TTP in L glutes and piriformis  LUMBAR ROM:    Active  AROM  Eval - 03/15/22  Flexion Hands to ankles  Extension 25% limited - lessens pain  Right lateral flexion Hand to lateral knee  Left lateral flexion Hand to femoral condyle  Right rotation WFL  Left rotation 25% limited   (Blank rows = not tested)  LOWER EXTREMITY ROM:     WFL  LOWER EXTREMITY MMT:    MMT Right eval Left eval  Hip flexion 4+ 4  Hip extension 4 4-  Hip abduction 4+ 4-  Hip adduction 4+ 4+  Hip internal rotation 4+ 4  Hip external rotation 4 4-  Knee flexion 5 4+  Knee extension 5 5  Ankle dorsiflexion 4+ 4+  Ankle plantarflexion    Ankle inversion    Ankle eversion     (Blank rows = not tested)  LUMBAR SPECIAL TESTS:  Straight leg raise test: Positive on L   TODAY'S TREATMENT:  04/15/22 THERAPEUTIC EXERCISE: to improve flexibility, strength and mobility.  Verbal and tactile cues throughout for technique. NuStep - L6 x 7 min (UE/LE) Standing figure-4 glute stretch 3 x 30" R/L lunge x 5 - limited with both by increased pain in L buttock Supported squat x 5 - limited by increased pain in L buttock  MANUAL THERAPY: To promote normalized muscle tension, improved flexibility, and reduced pain. Skilled palpation and monitoring of soft tissue during DN Trigger Point Dry-Needling  Treatment instructions: Expect mild to moderate muscle soreness. S/S of pneumothorax if dry needled over a lung field, and to seek immediate medical attention should they occur. Patient verbalized understanding of these instructions and education. Patient Consent Given: Yes Education handout provided: Previously provided Muscles treated: L medial glute medius Electrical stimulation performed: No Parameters: N/A Treatment response/outcome: Twitch Response Elicited and Palpable Increase in Muscle Length STM/DTM, manual TPR and pin & stretch to muscles addressed with DN  MODALITIES: IFC to L low back and buttocks, 80-150 Hz, intensity to patient tolerance x 15  min Ice pack to L low back and buttocks   04/12/22 THERAPEUTIC EXERCISE: to improve flexibility, strength and mobility.  Verbal and tactile cues throughout for technique. NuStep - L6 x 7 min (UE/LE) L figure-4 piriformis stretch with overpressure 2 x 30" L heel  to hip figure-4 piriformis stretch 2 x 30" L/R figure-4 to chest piriformis stretch 2 x 30" - towel for added reach Hooklying TrA + GTB clam 10 x 3" Bridge + GTB clam 10 x 3"  MANUAL THERAPY: To promote normalized muscle tension, improved flexibility, and reduced pain. Skilled palpation and monitoring of soft tissue during DN Trigger Point Dry-Needling  Treatment instructions: Expect mild to moderate muscle soreness. S/S of pneumothorax if dry needled over a lung field, and to seek immediate medical attention should they occur. Patient verbalized understanding of these instructions and education. Patient Consent Given: Yes Education handout provided: Previously provided Muscles treated: L glute medius/minimus & medial/lateral piriformis Electrical stimulation performed: No Parameters: N/A Treatment response/outcome: Twitch Response Elicited and Palpable Increase in Muscle Length STM/DTM, manual TPR and pin & stretch to muscles addressed with DN   04/05/22 THERAPEUTIC EXERCISE: to improve flexibility, strength and mobility.  Verbal and tactile cues throughout for technique. NuStep - L5 x 7 min (UE/LE) L SKTC stretch 2 x 30" L/R heel to hip figure-4 piriformis stretch 2 x 30" L/R figure-4 to chest piriformis stretch 2 x 30"  SELF CARE: Provided education in proper posture and body mechanics for typical daily positioning and household chores to minimize strain on low back.  MANUAL THERAPY: To promote normalized muscle tension, improved flexibility, and reduced pain. Skilled palpation and monitoring of soft tissue during DN Trigger Point Dry-Needling  Treatment instructions: Expect mild to moderate muscle soreness. Patient  verbalized understanding of these instructions and education. Patient Consent Given: Yes Education handout provided: Previously provided Muscles treated: L glute medius/minimus & piriformis Electrical stimulation performed: No Parameters: N/A Treatment response/outcome: Twitch Response Elicited and Palpable Increase in Muscle Length STM/DTM, manual TPR and pin & stretch to muscles addressed with DN   PATIENT EDUCATION:  Education details: home TENS unit options Person educated: Patient Education method: Explanation and Handouts Education comprehension: verbalized understanding   HOME EXERCISE PROGRAM: Access Code: [email or web address] URL: [email or web address] Date: 04/15/2022 Prepared by: Annie Paras  Exercises - Hooklying Hamstring Stretch with Strap  - 2-3 x daily - 7 x weekly - 3 reps - 30 sec hold - Supine ITB Stretch with Strap  - 2-3 x daily - 7 x weekly - 3 reps - 30 sec hold - Supine Piriformis Stretch with Leg Straight  - 2-3 x daily - 7 x weekly - 3 reps - 30 sec hold - Supine Figure 4 Piriformis Stretch  - 2-3 x daily - 7 x weekly - 3 reps - 30 sec hold - Supine Piriformis Stretch Pulling Heel to Hip  - 2-3 x daily - 7 x weekly - 3 reps - 30 sec hold - Supine Lower Trunk Rotation  - 2-3 x daily - 7 x weekly - 5 reps - 10 sec hold - Supine March with Resistance Band  - 1 x daily - 7 x weekly - 3 sets - 10 reps - Clamshell with Resistance  - 1 x daily - 7 x weekly - 3 sets - 10 reps - Supine Active Straight Leg Raise  - 1 x daily - 7 x weekly - 3 sets - 10 reps  Patient Education - Trigger Point Dry Needling - Posture and Body Mechanics - TENS Therapy - TENS Unit  ASSESSMENT:  CLINICAL IMPRESSION: Sejla reports mild increased pain to day but did not complete her stretches yesterday and has not taken any ibuprofen since this morning. Attempted to progress stretches and exercises to standing today but  limited by increased pain, therefore revisited the manual  therapy and DN. Good twitch responses elicited resulting in palpable reduction in muscle tension. Session concluded with estim and ice pack to L low back and buttocks to promote further muscle relaxation/relief from muscle spasm. Info provided on home TENS relief as pt noting benefit from this.  OBJECTIVE IMPAIRMENTS: Abnormal gait, decreased activity tolerance, decreased endurance, decreased knowledge of condition, decreased mobility, difficulty walking, decreased ROM, decreased strength, increased fascial restrictions, impaired perceived functional ability, increased muscle spasms, impaired flexibility, improper body mechanics, postural dysfunction, and pain.   ACTIVITY LIMITATIONS: carrying, lifting, bending, sitting, standing, squatting, sleeping, stairs, transfers, bed mobility, bathing, and locomotion level  PARTICIPATION LIMITATIONS: meal prep, cleaning, laundry, driving, shopping, community activity, occupation, and yard work  PERSONAL FACTORS: Past/current experiences, Profession, Time since onset of injury/illness/exacerbation, and 3+ comorbidities: HTN, thyroid disease/hypothyroidism, neck pain, R shoulder bursitis, elbow tendinitis, anxiety  are also affecting patient's functional outcome.   REHAB POTENTIAL: Good  CLINICAL DECISION MAKING: Evolving/moderate complexity  EVALUATION COMPLEXITY: Moderate   GOALS: Goals reviewed with patient? Yes  SHORT TERM GOALS: Target date: 04/05/2022   Patient will be independent with initial HEP to improve outcomes and carryover.  Baseline:  Goal status: MET - 03/31/22  2.  Patient will report centralization of radicular symptoms.  Baseline:  Goal status: IN PROGRESS - 04/12/22 - Pt reports radicular pain less common but when present, now extending only into thigh/calf rather than all the way down her leg  LONG TERM GOALS: Target date: 04/26/2022    Patient will be independent with ongoing/advanced HEP for self-management at home.   Baseline:  Goal status: IN PROGRESS  2.  Patient will report 75% improvement in low back pain & L LE radicular pain to improve QOL.  Baseline:  Goal status: IN PROGRESS  04/12/22 - Pt reports 60% improvement  3.  Patient to demonstrate ability to achieve and maintain good spinal alignment/posturing and body mechanics needed for daily activities. Baseline:  Goal status: IN PROGRESS  04/12/22 - Pt reports good understanding of posture and body mechanics  4.  Patient will demonstrate full pain free lumbar ROM to perform ADLs.   Baseline:  Goal status: IN PROGRESS  5.  Patient will demonstrate improved B LE strength to >/= 4+/5 for improved stability and ease of mobility . Baseline:  Goal status: IN PROGRESS  6.  Patient will report 59 on lumbar FOTO to demonstrate improved functional ability.  Baseline: Lumbar FOTO = 42 Goal status: IN PROGRESS   7.  Patient to report ability to perform ADLs, household, leisure and work-related tasks without increased pain. Baseline:  Goal status: IN PROGRESS   PLAN: PT FREQUENCY: 2x/week  PT DURATION: 6 weeks  PLANNED INTERVENTIONS: Therapeutic exercises, Therapeutic activity, Neuromuscular re-education, Balance training, Gait training, Patient/Family education, Self Care, Joint mobilization, Dry Needling, Electrical stimulation, Spinal mobilization, Cryotherapy, Moist heat, Taping, Traction, Ultrasound, Ionotophoresis 93m/ml Dexamethasone, Manual therapy, and Re-evaluation.  PLAN FOR NEXT SESSION:  Assess response to DN; progress lumbopelvic flexibility and strengthening with extension bias; MT +/- DN to address pain and abnormal muscle tension   JPercival Spanish PT 04/15/2022, 3:53 PM

## 2022-04-19 ENCOUNTER — Ambulatory Visit: Payer: Commercial Managed Care - HMO

## 2022-04-19 DIAGNOSIS — M6281 Muscle weakness (generalized): Secondary | ICD-10-CM

## 2022-04-19 DIAGNOSIS — R252 Cramp and spasm: Secondary | ICD-10-CM

## 2022-04-19 DIAGNOSIS — M5416 Radiculopathy, lumbar region: Secondary | ICD-10-CM

## 2022-04-19 DIAGNOSIS — R2689 Other abnormalities of gait and mobility: Secondary | ICD-10-CM

## 2022-04-19 DIAGNOSIS — R262 Difficulty in walking, not elsewhere classified: Secondary | ICD-10-CM

## 2022-04-19 NOTE — Therapy (Signed)
OUTPATIENT PHYSICAL THERAPY TREATMENT   Patient Name: Carol Ho MRN: [id number] DOB:[birth year], 56 y.o., female Today's Date: 04/19/2022   PT End of Session - 04/19/22 1447     Visit Number 8    Date for PT Re-Evaluation 04/26/22    Authorization Type [employer]    PT Start Time 1402    PT Stop Time 5974    PT Time Calculation (min) 42 min    Activity Tolerance Patient tolerated treatment well    Behavior During Therapy [facility] for tasks assessed/performed                   Past Medical History:  Diagnosis Date   Hypertension    Thyroid disease    Past Surgical History:  Procedure Laterality Date   ELBOW SURGERY     TONSILLECTOMY     Patient Active Problem List   Diagnosis Date Noted   Tobacco use 02/15/2022   Chronic left-sided low back pain with left-sided sciatica 02/05/2022   Daytime somnolence 01/21/2022   Obstructive sleep apnea (adult) (pediatric) 01/21/2022   Bursitis of right shoulder 01/05/2021   Alcohol use 07/03/2015   Benign essential hypertension 07/03/2015   Discolored skin 07/03/2015   Elbow tendonitis 07/03/2015   Generalized anxiety disorder 07/03/2015   High risk medication use 07/03/2015   Hypothyroidism 07/03/2015   Neck pain 07/03/2015   Tremor of left hand 07/03/2015   Weakness of left upper extremity 07/03/2015    PCP: Charyl Dancer, NP  REFERRING PROVIDER: Lanae Crumbly, PA-C  REFERRING DIAG: [phone] (ICD-10-CM) - Other spondylosis with radiculopathy, lumbar region  RATIONALE FOR EVALUATION AND TREATMENT: Rehabilitation  THERAPY DIAG:  Radiculopathy, lumbar region  Cramp and spasm  Muscle weakness (generalized)  Other abnormalities of gait and mobility  Difficulty in walking, not elsewhere classified  ONSET DATE: 3+ months  NEXT MD VISIT:  04/29/22   SUBJECTIVE:                                                                                                                                                                                            SUBJECTIVE STATEMENT: Doing good today, used the TENS over the weekend but wants to clarify some things.  PAIN:  Are you having pain? Yes: NPRS scale: 3-4/10 Pain location: L buttock & L low back, shooting pain with numbness and tingling down L LE to thigh still occasionally present but less common Pain description: sometime stabbing, mostly throbbing Aggravating factors: prolonged stationary positioning in sitting Relieving factors: stop what she is doing and start deep breathing  PERTINENT HISTORY: HTN, thyroid disease/hypothyroidism, neck pain, R shoulder  bursitis, elbow tendinitis, anxiety  PRECAUTIONS: None  WEIGHT BEARING RESTRICTIONS: No  FALLS:  Has patient fallen in last 6 months? 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 No acute abnormality.  PATIENT SURVEYS:  Modified Oswestry 25/50 = 50.0 % - severe disability  FOTO Lumbar = 42, predicted = 56  SCREENING FOR RED FLAGS: Bowel or bladder incontinence: No Spinal tumors: No Cauda equina syndrome: No Compression fracture: No Abdominal aneurysm: No  COGNITION:  Overall cognitive status: Within functional limits for tasks assessed     SENSATION: WFL Intermittent radicular pain, numbness and tingling down L LE  MUSCLE LENGTH: Hamstrings: mild tight L>R ITB: mod tight L>R Piriformis: mod tight L>R Hip flexors: mild tight L>R Quads: WFL Heelcord: NT  POSTURE:  No Significant postural limitations  PALPATION: TTP in L glutes and piriformis  LUMBAR ROM:   Active  AROM  Eval - 03/15/22  Flexion Hands to ankles   Extension 25% limited - lessens pain  Right lateral flexion Hand to lateral knee  Left lateral flexion Hand to femoral condyle  Right rotation WFL  Left rotation 25% limited   (Blank rows = not tested)  LOWER EXTREMITY ROM:     WFL  LOWER EXTREMITY MMT:    MMT Right eval Left eval  Hip flexion 4+ 4  Hip extension 4 4-  Hip abduction 4+ 4-  Hip adduction 4+ 4+  Hip internal rotation 4+ 4  Hip external rotation 4 4-  Knee flexion 5 4+  Knee extension 5 5  Ankle dorsiflexion 4+ 4+  Ankle plantarflexion    Ankle inversion    Ankle eversion     (Blank rows = not tested)  LUMBAR SPECIAL TESTS:  Straight leg raise test: Positive on L   TODAY'S TREATMENT: 04/19/22 TherEx: Rec Bike L1x23mn With TENS unit on: Supine figure 4 piriformis stretch 2x30 sec bil Supine SKTC 2x30 sec bil Standing lumbar extension at counter 10x3" Standing open book bil 10x3"  Self care: Review of set up of TENS unit for at home use 04/15/22 THERAPEUTIC EXERCISE: to improve flexibility, strength and mobility.  Verbal and tactile cues throughout for technique. NuStep - L6 x 7 min (UE/LE) Standing figure-4 glute stretch 3 x 30" R/L lunge x 5 - limited with both by increased pain in L buttock Supported squat x 5 - limited by increased pain in L buttock  MANUAL THERAPY: To promote normalized muscle tension, improved flexibility, and reduced pain. Skilled palpation and monitoring of soft tissue during DN Trigger Point Dry-Needling  Treatment instructions: Expect mild to moderate muscle soreness. S/S of pneumothorax if dry needled over a lung field, and to seek immediate medical attention should they occur. Patient verbalized understanding of these instructions and education. Patient Consent Given: Yes Education handout provided: Previously provided Muscles treated: L medial glute medius Electrical stimulation performed: No Parameters: N/A Treatment response/outcome: Twitch Response Elicited  and Palpable Increase in Muscle Length STM/DTM, manual TPR and pin & stretch to muscles addressed with DN  MODALITIES: IFC to L low back and buttocks, 80-150 Hz, intensity to patient tolerance x 15 min Ice pack to L low back and buttocks   04/12/22  THERAPEUTIC EXERCISE: to improve flexibility, strength and mobility.  Verbal and tactile cues throughout for technique. NuStep - L6 x 7 min (UE/LE) L figure-4 piriformis stretch with overpressure 2 x 30" L heel to hip figure-4 piriformis stretch 2 x 30" L/R figure-4 to chest piriformis stretch 2 x 30" - towel for added reach Hooklying TrA + GTB clam 10 x 3" Bridge + GTB clam 10 x 3"  MANUAL THERAPY: To promote normalized muscle tension, improved flexibility, and reduced pain. Skilled palpation and monitoring of soft tissue during DN Trigger Point Dry-Needling  Treatment instructions: Expect mild to moderate muscle soreness. S/S of pneumothorax if dry needled over a lung field, and to seek immediate medical attention should they occur. Patient verbalized understanding of these instructions and education. Patient Consent Given: Yes Education handout provided: Previously provided Muscles treated: L glute medius/minimus & medial/lateral piriformis Electrical stimulation performed: No Parameters: N/A Treatment response/outcome: Twitch Response Elicited and Palpable Increase in Muscle Length STM/DTM, manual TPR and pin & stretch to muscles addressed with DN   04/05/22 THERAPEUTIC EXERCISE: to improve flexibility, strength and mobility.  Verbal and tactile cues throughout for technique. NuStep - L5 x 7 min (UE/LE) L SKTC stretch 2 x 30" L/R heel to hip figure-4 piriformis stretch 2 x 30" L/R figure-4 to chest piriformis stretch 2 x 30"  SELF CARE: Provided education in proper posture and body mechanics for typical daily positioning and household chores to minimize strain on low back.  MANUAL THERAPY: To promote normalized muscle tension,  improved flexibility, and reduced pain. Skilled palpation and monitoring of soft tissue during DN Trigger Point Dry-Needling  Treatment instructions: Expect mild to moderate muscle soreness. Patient verbalized understanding of these instructions and education. Patient Consent Given: Yes Education handout provided: Previously provided Muscles treated: L glute medius/minimus & piriformis Electrical stimulation performed: No Parameters: N/A Treatment response/outcome: Twitch Response Elicited and Palpable Increase in Muscle Length STM/DTM, manual TPR and pin & stretch to muscles addressed with DN   PATIENT EDUCATION:  Education details: home TENS unit options Person educated: Patient Education method: Explanation and Handouts Education comprehension: verbalized understanding   HOME EXERCISE PROGRAM: Access Code: [email or web address] URL: [email or web address] Date: 04/15/2022 Prepared by: Annie Paras  Exercises - Hooklying Hamstring Stretch with Strap  - 2-3 x daily - 7 x weekly - 3 reps - 30 sec hold - Supine ITB Stretch with Strap  - 2-3 x daily - 7 x weekly - 3 reps - 30 sec hold - Supine Piriformis Stretch with Leg Straight  - 2-3 x daily - 7 x weekly - 3 reps - 30 sec hold - Supine Figure 4 Piriformis Stretch  - 2-3 x daily - 7 x weekly - 3 reps - 30 sec hold - Supine Piriformis Stretch Pulling Heel to Hip  - 2-3 x daily - 7 x weekly - 3 reps - 30 sec hold - Supine Lower Trunk Rotation  - 2-3 x daily - 7 x weekly - 5 reps - 10 sec hold - Supine March with Resistance Band  - 1 x daily - 7 x weekly - 3 sets - 10 reps - Clamshell with Resistance  - 1 x daily - 7 x weekly - 3 sets - 10 reps - Supine Active Straight Leg Raise  - 1 x daily - 7 x weekly - 3 sets - 10 reps  Patient Education - Trigger Point Dry Needling - Posture and Body Mechanics - TENS Therapy - TENS Unit  ASSESSMENT:  CLINICAL  IMPRESSION: Pt arrived with TENS unit that she has been using but wanted to  clarify some things as far as setting it up. Spent time reviewing set-up of TENS unit with her today to ensure understanding for at home use. Also did exercises today with TENS unit on focusing on hip and lumbopelvic flexibility, which seemed to do well for her. Pt completed session with no complaints of pain.  OBJECTIVE IMPAIRMENTS: Abnormal gait, decreased activity tolerance, decreased endurance, decreased knowledge of condition, decreased mobility, difficulty walking, decreased ROM, decreased strength, increased fascial restrictions, impaired perceived functional ability, increased muscle spasms, impaired flexibility, improper body mechanics, postural dysfunction, and pain.   ACTIVITY LIMITATIONS: carrying, lifting, bending, sitting, standing, squatting, sleeping, stairs, transfers, bed mobility, bathing, and locomotion level  PARTICIPATION LIMITATIONS: meal prep, cleaning, laundry, driving, shopping, community activity, occupation, and yard work  PERSONAL FACTORS: Past/current experiences, Profession, Time since onset of injury/illness/exacerbation, and 3+ comorbidities: HTN, thyroid disease/hypothyroidism, neck pain, R shoulder bursitis, elbow tendinitis, anxiety  are also affecting patient's functional outcome.   REHAB POTENTIAL: Good  CLINICAL DECISION MAKING: Evolving/moderate complexity  EVALUATION COMPLEXITY: Moderate   GOALS: Goals reviewed with patient? Yes  SHORT TERM GOALS: Target date: 04/05/2022   Patient will be independent with initial HEP to improve outcomes and carryover.  Baseline:  Goal status: MET - 03/31/22  2.  Patient will report centralization of radicular symptoms.  Baseline:  Goal status: IN PROGRESS - 04/12/22 - Pt reports radicular pain less common but when present, now extending only into thigh/calf rather than all the way down her leg  LONG TERM GOALS: Target date: 04/26/2022    Patient will be independent with ongoing/advanced HEP for self-management at  home.  Baseline:  Goal status: IN PROGRESS  2.  Patient will report 75% improvement in low back pain & L LE radicular pain to improve QOL.  Baseline:  Goal status: IN PROGRESS  04/12/22 - Pt reports 60% improvement  3.  Patient to demonstrate ability to achieve and maintain good spinal alignment/posturing and body mechanics needed for daily activities. Baseline:  Goal status: IN PROGRESS  04/12/22 - Pt reports good understanding of posture and body mechanics  4.  Patient will demonstrate full pain free lumbar ROM to perform ADLs.   Baseline:  Goal status: IN PROGRESS  5.  Patient will demonstrate improved B LE strength to >/= 4+/5 for improved stability and ease of mobility . Baseline:  Goal status: IN PROGRESS  6.  Patient will report 20 on lumbar FOTO to demonstrate improved functional ability.  Baseline: Lumbar FOTO = 42 Goal status: IN PROGRESS   7.  Patient to report ability to perform ADLs, household, leisure and work-related tasks without increased pain. Baseline:  Goal status: IN PROGRESS   PLAN: PT FREQUENCY: 2x/week  PT DURATION: 6 weeks  PLANNED INTERVENTIONS: Therapeutic exercises, Therapeutic activity, Neuromuscular re-education, Balance training, Gait training, Patient/Family education, Self Care, Joint mobilization, Dry Needling, Electrical stimulation, Spinal mobilization, Cryotherapy, Moist heat, Taping, Traction, Ultrasound, Ionotophoresis 84m/ml Dexamethasone, Manual therapy, and Re-evaluation.  PLAN FOR NEXT SESSION:  progress lumbopelvic flexibility and strengthening with extension bias; MT +/- DN to address pain and abnormal muscle tension   Heavin Sebree L Loyd Salvador, PTA 04/19/2022, 2:47 PM

## 2022-04-22 ENCOUNTER — Ambulatory Visit: Payer: Commercial Managed Care - HMO

## 2022-04-22 DIAGNOSIS — R2689 Other abnormalities of gait and mobility: Secondary | ICD-10-CM

## 2022-04-22 DIAGNOSIS — R262 Difficulty in walking, not elsewhere classified: Secondary | ICD-10-CM

## 2022-04-22 DIAGNOSIS — M6281 Muscle weakness (generalized): Secondary | ICD-10-CM

## 2022-04-22 DIAGNOSIS — R252 Cramp and spasm: Secondary | ICD-10-CM

## 2022-04-22 DIAGNOSIS — M5416 Radiculopathy, lumbar region: Secondary | ICD-10-CM

## 2022-04-22 NOTE — Therapy (Signed)
OUTPATIENT PHYSICAL THERAPY TREATMENT   Patient Name: Carol Ho MRN: [id number] DOB:1966/05/30, 56 y.o., female Today's Date: 04/22/2022   PT End of Session - 04/22/22 1446     Visit Number 9    Date for PT Re-Evaluation 04/26/22    Authorization Type [employer]    PT Start Time 1402    PT Stop Time 9381    PT Time Calculation (min) 41 min    Activity Tolerance Patient tolerated treatment well    Behavior During Therapy [facility] for tasks assessed/performed                    Past Medical History:  Diagnosis Date   Hypertension    Thyroid disease    Past Surgical History:  Procedure Laterality Date   ELBOW SURGERY     TONSILLECTOMY     Patient Active Problem List   Diagnosis Date Noted   Tobacco use 02/15/2022   Chronic left-sided low back pain with left-sided sciatica 02/05/2022   Daytime somnolence 01/21/2022   Obstructive sleep apnea (adult) (pediatric) 01/21/2022   Bursitis of right shoulder 01/05/2021   Alcohol use 07/03/2015   Benign essential hypertension 07/03/2015   Discolored skin 07/03/2015   Elbow tendonitis 07/03/2015   Generalized anxiety disorder 07/03/2015   High risk medication use 07/03/2015   Hypothyroidism 07/03/2015   Neck pain 07/03/2015   Tremor of left hand 07/03/2015   Weakness of left upper extremity 07/03/2015    PCP: Charyl Dancer, NP  REFERRING PROVIDER: Lanae Crumbly, PA-C  REFERRING DIAG: [phone] (ICD-10-CM) - Other spondylosis with radiculopathy, lumbar region  RATIONALE FOR EVALUATION AND TREATMENT: Rehabilitation  THERAPY DIAG:  Radiculopathy, lumbar region  Cramp and spasm  Muscle weakness (generalized)  Other abnormalities of gait and mobility  Difficulty in walking, not elsewhere classified  ONSET DATE: 3+ months  NEXT MD VISIT:  04/29/22   SUBJECTIVE:                                                                                                                                                                                            SUBJECTIVE STATEMENT: Pt reports stiffness in the back today.  PAIN:  Are you having pain? Yes: NPRS scale: 3-4/10 Pain location: L buttock & L low back, shooting pain with numbness and tingling down L LE to thigh still occasionally present but less common Pain description: sometime stabbing, mostly throbbing Aggravating factors: prolonged stationary positioning in sitting Relieving factors: stop what she is doing and start deep breathing  PERTINENT HISTORY: HTN, thyroid disease/hypothyroidism, neck pain, R shoulder bursitis, elbow tendinitis, anxiety  PRECAUTIONS: None  WEIGHT BEARING RESTRICTIONS: No  FALLS:  Has patient fallen in last 6 months? 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 No acute abnormality.  PATIENT SURVEYS:  Modified Oswestry 25/50 = 50.0 % - severe disability  FOTO Lumbar = 42, predicted = 56  SCREENING FOR RED FLAGS: Bowel or bladder incontinence: No Spinal tumors: No Cauda equina syndrome: No Compression fracture: No Abdominal aneurysm: No  COGNITION:  Overall cognitive status: Within functional limits for tasks assessed     SENSATION: WFL Intermittent radicular pain, numbness and tingling down L LE  MUSCLE LENGTH: Hamstrings: mild tight L>R ITB: mod tight L>R Piriformis: mod tight L>R Hip flexors: mild tight L>R Quads: WFL Heelcord: NT  POSTURE:  No Significant postural limitations  PALPATION: TTP in L glutes and piriformis  LUMBAR ROM:   Active  AROM  Eval - 03/15/22 AROM 04/22/22  Flexion Hands to ankles Hands to feet  Extension  25% limited - lessens pain WFL  Right lateral flexion Hand to lateral knee Hand to lateral knee  Left lateral flexion Hand to femoral condyle Hand to lateral knee - slightly more limited than R side  Right rotation [facility] WFL  Left rotation 25% limited 25% limited   (Blank rows = not tested)  LOWER EXTREMITY ROM:     WFL  LOWER EXTREMITY MMT:    MMT Right eval Left eval  Hip flexion 4+ 4  Hip extension 4 4-  Hip abduction 4+ 4-  Hip adduction 4+ 4+  Hip internal rotation 4+ 4  Hip external rotation 4 4-  Knee flexion 5 4+  Knee extension 5 5  Ankle dorsiflexion 4+ 4+  Ankle plantarflexion    Ankle inversion    Ankle eversion     (Blank rows = not tested)  LUMBAR SPECIAL TESTS:  Straight leg raise test: Positive on L   TODAY'S TREATMENT: 04/22/22 THERAPEUTIC EXERCISE: to improve flexibility, strength and mobility.  Verbal and tactile cues throughout for technique. Rec Bike L1x5mn Prone press ups 10x 5 sec holds Prone knee bends x 10 bil Prone quad stretch with strap 2x30" bil Prone hip extension 2x10 bil Quadruped glute kicks x 10 bil Bird dog x 10 bil  Manual Therapy: STM to L ITB with rolling stick  04/19/22 TherEx: Rec Bike L1x667m With TENS unit on: Supine figure 4 piriformis stretch 2x30 sec bil Supine SKTC 2x30 sec bil Standing lumbar extension at counter 10x3" Standing open book bil 10x3"  Self care: Review of set up of TENS unit for at home use 04/15/22 THERAPEUTIC EXERCISE: to improve flexibility, strength and mobility.  Verbal and tactile cues throughout for technique. NuStep - L6 x 7 min (UE/LE) Standing figure-4 glute stretch 3 x 30" R/L lunge x 5 - limited with both by increased pain in L buttock Supported squat x 5 - limited by increased pain in L buttock  MANUAL THERAPY: To promote normalized muscle tension, improved flexibility, and reduced pain. Skilled palpation and monitoring of soft tissue during DN Trigger Point Dry-Needling   Treatment instructions: Expect mild to moderate muscle soreness. S/S of pneumothorax if dry needled over a lung field, and to seek immediate medical attention should  they occur. Patient verbalized understanding of these instructions and education. Patient Consent Given: Yes Education handout provided: Previously provided Muscles treated: L medial glute medius Electrical stimulation performed: No Parameters: N/A Treatment response/outcome: Twitch Response Elicited and Palpable Increase in Muscle Length STM/DTM, manual TPR and pin & stretch to muscles addressed with DN  MODALITIES: IFC to L low back and buttocks, 80-150 Hz, intensity to patient tolerance x 15 min Ice pack to L low back and buttocks   04/12/22 THERAPEUTIC EXERCISE: to improve flexibility, strength and mobility.  Verbal and tactile cues throughout for technique. NuStep - L6 x 7 min (UE/LE) L figure-4 piriformis stretch with overpressure 2 x 30" L heel to hip figure-4 piriformis stretch 2 x 30" L/R figure-4 to chest piriformis stretch 2 x 30" - towel for added reach Hooklying TrA + GTB clam 10 x 3" Bridge + GTB clam 10 x 3"  MANUAL THERAPY: To promote normalized muscle tension, improved flexibility, and reduced pain. Skilled palpation and monitoring of soft tissue during DN Trigger Point Dry-Needling  Treatment instructions: Expect mild to moderate muscle soreness. S/S of pneumothorax if dry needled over a lung field, and to seek immediate medical attention should they occur. Patient verbalized understanding of these instructions and education. Patient Consent Given: Yes Education handout provided: Previously provided Muscles treated: L glute medius/minimus & medial/lateral piriformis Electrical stimulation performed: No Parameters: N/A Treatment response/outcome: Twitch Response Elicited and Palpable Increase in Muscle Length STM/DTM, manual TPR and pin & stretch to muscles addressed with DN   04/05/22 THERAPEUTIC  EXERCISE: to improve flexibility, strength and mobility.  Verbal and tactile cues throughout for technique. NuStep - L5 x 7 min (UE/LE) L SKTC stretch 2 x 30" L/R heel to hip figure-4 piriformis stretch 2 x 30" L/R figure-4 to chest piriformis stretch 2 x 30"  SELF CARE: Provided education in proper posture and body mechanics for typical daily positioning and household chores to minimize strain on low back.  MANUAL THERAPY: To promote normalized muscle tension, improved flexibility, and reduced pain. Skilled palpation and monitoring of soft tissue during DN Trigger Point Dry-Needling  Treatment instructions: Expect mild to moderate muscle soreness. Patient verbalized understanding of these instructions and education. Patient Consent Given: Yes Education handout provided: Previously provided Muscles treated: L glute medius/minimus & piriformis Electrical stimulation performed: No Parameters: N/A Treatment response/outcome: Twitch Response Elicited and Palpable Increase in Muscle Length STM/DTM, manual TPR and pin & stretch to muscles addressed with DN   PATIENT EDUCATION:  Education details: home TENS unit options Person educated: Patient Education method: Explanation and Handouts Education comprehension: verbalized understanding   HOME EXERCISE PROGRAM: Access Code: [email or web address] URL: [email or web address] Date: 04/15/2022 Prepared by: Annie Paras  Exercises - Hooklying Hamstring Stretch with Strap  - 2-3 x daily - 7 x weekly - 3 reps - 30 sec hold - Supine ITB Stretch with Strap  - 2-3 x daily - 7 x weekly - 3 reps - 30 sec hold - Supine Piriformis Stretch with Leg Straight  - 2-3 x daily - 7 x weekly - 3 reps - 30 sec hold - Supine Figure 4 Piriformis Stretch  - 2-3 x daily - 7 x weekly - 3 reps - 30 sec hold - Supine Piriformis Stretch Pulling Heel to Hip  - 2-3 x daily - 7 x weekly - 3 reps - 30 sec hold - Supine Lower Trunk Rotation  - 2-3 x daily - 7 x weekly  - 5 reps - 10 sec hold -  Supine March with Resistance Band  - 1 x daily - 7 x weekly - 3 sets - 10 reps - Clamshell with Resistance  - 1 x daily - 7 x weekly - 3 sets - 10 reps - Supine Active Straight Leg Raise  - 1 x daily - 7 x weekly - 3 sets - 10 reps  Patient Education - Trigger Point Dry Needling - Posture and Body Mechanics - TENS Therapy - TENS Unit  ASSESSMENT:  CLINICAL IMPRESSION: Pt responded well. Continued to work on lumbopelvic mobility and extension based exercises to reduce radicular symptoms. Lumbar ROM has improved today, however most limited by L glute/low back with L side bend and flexion. She demonstrated more tenderness in L ITB today, which may be contributing to tenderness in glute area. Gave instruction on self STM with rolling stick at home. Pt would benefit from more PT to help further reduce radicular symptoms.  OBJECTIVE IMPAIRMENTS: Abnormal gait, decreased activity tolerance, decreased endurance, decreased knowledge of condition, decreased mobility, difficulty walking, decreased ROM, decreased strength, increased fascial restrictions, impaired perceived functional ability, increased muscle spasms, impaired flexibility, improper body mechanics, postural dysfunction, and pain.   ACTIVITY LIMITATIONS: carrying, lifting, bending, sitting, standing, squatting, sleeping, stairs, transfers, bed mobility, bathing, and locomotion level  PARTICIPATION LIMITATIONS: meal prep, cleaning, laundry, driving, shopping, community activity, occupation, and yard work  PERSONAL FACTORS: Past/current experiences, Profession, Time since onset of injury/illness/exacerbation, and 3+ comorbidities: HTN, thyroid disease/hypothyroidism, neck pain, R shoulder bursitis, elbow tendinitis, anxiety  are also affecting patient's functional outcome.   REHAB POTENTIAL: Good  CLINICAL DECISION MAKING: Evolving/moderate complexity  EVALUATION COMPLEXITY: Moderate   GOALS: Goals reviewed  with patient? Yes  SHORT TERM GOALS: Target date: 04/05/2022   Patient will be independent with initial HEP to improve outcomes and carryover.  Baseline:  Goal status: MET - 03/31/22  2.  Patient will report centralization of radicular symptoms.  Baseline:  Goal status: IN PROGRESS - 04/12/22 - Pt reports radicular pain less common but when present, now extending only into thigh/calf rather than all the way down her leg  LONG TERM GOALS: Target date: 04/26/2022    Patient will be independent with ongoing/advanced HEP for self-management at home.  Baseline:  Goal status: IN PROGRESS  2.  Patient will report 75% improvement in low back pain & L LE radicular pain to improve QOL.  Baseline:  Goal status: IN PROGRESS  04/12/22 - Pt reports 60% improvement  3.  Patient to demonstrate ability to achieve and maintain good spinal alignment/posturing and body mechanics needed for daily activities. Baseline:  Goal status: IN PROGRESS  04/12/22 - Pt reports good understanding of posture and body mechanics  4.  Patient will demonstrate full pain free lumbar ROM to perform ADLs.   Baseline:  Goal status: IN PROGRESS  5.  Patient will demonstrate improved B LE strength to >/= 4+/5 for improved stability and ease of mobility . Baseline:  Goal status: IN PROGRESS  6.  Patient will report 25 on lumbar FOTO to demonstrate improved functional ability.  Baseline: Lumbar FOTO = 42 Goal status: IN PROGRESS   7.  Patient to report ability to perform ADLs, household, leisure and work-related tasks without increased pain. Baseline:  Goal status: IN PROGRESS   PLAN: PT FREQUENCY: 2x/week  PT DURATION: 6 weeks  PLANNED INTERVENTIONS: Therapeutic exercises, Therapeutic activity, Neuromuscular re-education, Balance training, Gait training, Patient/Family education, Self Care, Joint mobilization, Dry Needling, Electrical stimulation, Spinal mobilization, Cryotherapy, Moist heat, Taping,  Traction,  Ultrasound, Ionotophoresis 55m/ml Dexamethasone, Manual therapy, and Re-evaluation.  PLAN FOR NEXT SESSION:  progress lumbopelvic flexibility and strengthening with extension bias; MT +/- DN to address pain and abnormal muscle tension   Chevelle Coulson L Hisae Decoursey, PTA 04/22/2022, 2:47 PM

## 2022-04-26 ENCOUNTER — Encounter: Payer: Commercial Managed Care - HMO | Admitting: Physical Therapy

## 2022-04-28 ENCOUNTER — Other Ambulatory Visit: Payer: Self-pay | Admitting: Nurse Practitioner

## 2022-04-29 ENCOUNTER — Encounter: Payer: Self-pay | Admitting: Physical Therapy

## 2022-04-29 ENCOUNTER — Ambulatory Visit: Payer: Commercial Managed Care - HMO | Admitting: Surgery

## 2022-04-29 ENCOUNTER — Ambulatory Visit: Payer: Commercial Managed Care - HMO | Admitting: Physical Therapy

## 2022-04-29 DIAGNOSIS — M6281 Muscle weakness (generalized): Secondary | ICD-10-CM

## 2022-04-29 DIAGNOSIS — R252 Cramp and spasm: Secondary | ICD-10-CM

## 2022-04-29 DIAGNOSIS — M5416 Radiculopathy, lumbar region: Secondary | ICD-10-CM

## 2022-04-29 DIAGNOSIS — R262 Difficulty in walking, not elsewhere classified: Secondary | ICD-10-CM

## 2022-04-29 DIAGNOSIS — R2689 Other abnormalities of gait and mobility: Secondary | ICD-10-CM

## 2022-04-29 NOTE — Therapy (Addendum)
OUTPATIENT PHYSICAL THERAPY TREATMENT / DISCHARGE SUMMARY   Patient Name: Carol Ho MRN: [id number] DOB:1966-05-06, 56 y.o., female Today's Date: 04/29/2022   PT End of Session - 04/29/22 1403     Visit Number 10    Date for PT Re-Evaluation 04/26/22    Authorization Type [employer]    PT Start Time 1324    PT Stop Time 4010    PT Time Calculation (min) 38 min    Activity Tolerance Patient tolerated treatment well    Behavior During Therapy [facility] for tasks assessed/performed                     Past Medical History:  Diagnosis Date   Hypertension    Thyroid disease    Past Surgical History:  Procedure Laterality Date   ELBOW SURGERY     TONSILLECTOMY     Patient Active Problem List   Diagnosis Date Noted   Tobacco use 02/15/2022   Chronic left-sided low back pain with left-sided sciatica 02/05/2022   Daytime somnolence 01/21/2022   Obstructive sleep apnea (adult) (pediatric) 01/21/2022   Bursitis of right shoulder 01/05/2021   Alcohol use 07/03/2015   Benign essential hypertension 07/03/2015   Discolored skin 07/03/2015   Elbow tendonitis 07/03/2015   Generalized anxiety disorder 07/03/2015   High risk medication use 07/03/2015   Hypothyroidism 07/03/2015   Neck pain 07/03/2015   Tremor of left hand 07/03/2015   Weakness of left upper extremity 07/03/2015    PCP: Charyl Dancer, NP  REFERRING PROVIDER: Lanae Crumbly, PA-C  REFERRING DIAG: [phone] (ICD-10-CM) - Other spondylosis with radiculopathy, lumbar region  RATIONALE FOR EVALUATION AND TREATMENT: Rehabilitation  THERAPY DIAG:  Radiculopathy, lumbar region  Cramp and spasm  Muscle weakness (generalized)  Other abnormalities of gait and mobility  Difficulty in walking, not elsewhere classified  ONSET DATE: 3+ months  NEXT MD VISIT:  04/29/22   SUBJECTIVE:                                                                                                                                                                                            SUBJECTIVE STATEMENT: Pt reports stiffness more so in the morning but feels like she is better able to move around and complete the tasks she needs to as part of her daily work routine.  PAIN:  Are you having pain? Yes: NPRS scale: 3/10 Pain location: L upper buttock currently; intermittently in L lateral thigh Pain description: stabbing Aggravating factors: prolonged stationary positioning in sitting, being idle Relieving factors: movement  PERTINENT HISTORY: HTN, thyroid disease/hypothyroidism, neck pain, R shoulder bursitis,  elbow tendinitis, anxiety  PRECAUTIONS: None  WEIGHT BEARING RESTRICTIONS: No  FALLS:  Has patient fallen in last 6 months? 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 No acute abnormality.  PATIENT SURVEYS:  Modified Oswestry 25/50 = 50.0 % - severe disability  FOTO Lumbar = 42, predicted = 56  SCREENING FOR RED FLAGS: Bowel or bladder incontinence: No Spinal tumors: No Cauda equina syndrome: No Compression fracture: No Abdominal aneurysm: No  COGNITION:  Overall cognitive status: Within functional limits for tasks assessed     SENSATION: WFL Intermittent radicular pain, numbness and tingling down L LE  MUSCLE LENGTH: Hamstrings: mild tight L>R ITB: mod tight L>R Piriformis: mod tight L>R Hip flexors: mild tight L>R Quads: WFL Heelcord: NT  POSTURE:  No Significant postural limitations  PALPATION: TTP in L glutes and piriformis  LUMBAR ROM:   Active  AROM  Eval - 03/15/22 AROM 04/22/22  Flexion Hands to ankles  WNL  Extension 25% limited - lessens pain 25% limited - lessens pain  Right lateral flexion Hand to lateral knee WFL  Left lateral flexion Hand to femoral condyle WFL - slight pain  Right rotation [facility] WFL  Left rotation 25% limited WFL   (Blank rows = not tested)  LOWER EXTREMITY ROM:     [facility]  LOWER EXTREMITY MMT:    MMT Right eval Left eval Right 04/29/22 Left  04/29/22  Hip flexion 4+ _0 Hip extension 4 4- 5 5  Hip abduction 4+ 4- 5 5  Hip adduction 4+ 4+ 4+ 4  Hip internal rotation 4+ 4 4+ 4+  Hip external rotation 4 4- 4+ 4  Knee flexion 5 4+ 5 5  Knee extension _1 Ankle dorsiflexion 4+ 4+ 5 5  Ankle plantarflexion      Ankle inversion      Ankle eversion       (Blank rows = not tested)  LUMBAR SPECIAL TESTS:  Straight leg raise test: Positive on L   TODAY'S TREATMENT:  04/29/22 THERAPEUTIC EXERCISE: to improve flexibility, strength and mobility.  Verbal and tactile cues throughout for technique. Rec bike L3 x 6 min  THERAPEUTIC ACTIVITIES: Modified Oswestry 6 / 50 = 12.0 %  FOTO Lumbar = 65 ROM & MMT Goal assessment   04/22/22 THERAPEUTIC EXERCISE: to improve flexibility, strength and mobility.  Verbal and tactile cues throughout for technique. Rec Bike L1x66mn Prone press ups 10x 5 sec holds Prone knee bends x 10 bil Prone quad stretch with strap 2x30" bil Prone hip extension 2x10 bil Quadruped glute kicks x 10 bil Bird dog x 10 bil  Manual Therapy: STM to L ITB with rolling stick   04/19/22 TherEx: Rec Bike L1x648m With TENS unit on: Supine figure 4 piriformis stretch 2x30 sec bil Supine SKTC 2x30 sec bil Standing lumbar extension at counter 10x3" Standing open book bil 10x3"  Self care: Review of set up of TENS unit for at home use   PATIENT EDUCATION:  Education details: recommended frequency for ongoing HEP at discharge to prevent loss of gains achieved with PT Person educated: Patient Education method:  Explanation Education comprehension: verbalized understanding  HOME EXERCISE PROGRAM: Access Code: [email or web address] URL: [email or web address] Date: 04/15/2022 Prepared by: Annie Paras  Exercises - Hooklying Hamstring Stretch with Strap  - 2-3 x daily - 7 x weekly - 3 reps - 30 sec hold - Supine ITB Stretch with Strap  - 2-3 x daily - 7 x weekly - 3 reps - 30 sec hold - Supine Piriformis Stretch with Leg Straight  - 2-3 x daily - 7 x weekly - 3 reps - 30 sec hold - Supine Figure 4 Piriformis Stretch  - 2-3 x daily - 7 x weekly - 3 reps - 30 sec hold - Supine Piriformis Stretch Pulling Heel to Hip  - 2-3 x daily - 7 x weekly - 3 reps - 30 sec hold - Supine Lower Trunk Rotation  - 2-3 x daily - 7 x weekly - 5 reps - 10 sec hold - Supine March with Resistance Band  - 1 x daily - 7 x weekly - 3 sets - 10 reps - Clamshell with Resistance  - 1 x daily - 7 x weekly - 3 sets - 10 reps - Supine Active Straight Leg Raise  - 1 x daily - 7 x weekly - 3 sets - 10 reps  Patient Education - Trigger Point Dry Needling - Posture and Body Mechanics - TENS Therapy - TENS Unit  ASSESSMENT:  CLINICAL IMPRESSION: Lauriel reports 90% improvement in her LBP and L LE radicular pain. She has been able to resume her normal daily tasks including ADLs, recreational and work-related tasks w/o significant pain interference, typically only noting increase in pain if she remains idle/sitting for too long. Lumbar ROM essentially WFL with only slight discomfort noted into L side bending. B LE strength is grossly improved but mild weakness still evident proximally, L>R. All goals met or partially met and pt feels confident that she will be able to continue to manage her pain with her HEP and continued use of good posture and body mechanics, therefore will proceed with transition to the HEP at this time. She would like to remain on a 30-day hold pending her MRI next week and the subsequent f/u with the MD in case  issues identified that would need to be addressed with PT.  OBJECTIVE IMPAIRMENTS: Abnormal gait, decreased activity tolerance, decreased endurance, decreased knowledge of condition, decreased mobility, difficulty walking, decreased ROM, decreased strength, increased fascial restrictions, impaired perceived functional ability, increased muscle spasms, impaired flexibility, improper body mechanics, postural dysfunction, and pain.   ACTIVITY LIMITATIONS: carrying, lifting, bending, sitting, standing, squatting, sleeping, stairs, transfers, bed mobility, bathing, and locomotion level  PARTICIPATION LIMITATIONS: meal prep, cleaning, laundry, driving, shopping, community activity, occupation, and yard work  PERSONAL FACTORS: Past/current experiences, Profession, Time since onset of injury/illness/exacerbation, and 3+ comorbidities: HTN, thyroid disease/hypothyroidism, neck pain, R shoulder bursitis, elbow tendinitis, anxiety  are also affecting patient's functional outcome.   REHAB POTENTIAL: Good  CLINICAL DECISION MAKING: Evolving/moderate complexity  EVALUATION COMPLEXITY: Moderate   GOALS: Goals reviewed with patient? Yes  SHORT TERM GOALS: Target date: 04/05/2022   Patient will be independent with initial HEP to improve outcomes and carryover.  Baseline:  Goal status: MET - 03/31/22  2.  Patient will report centralization of radicular symptoms.  Baseline:  Goal status: MET - 1026/23   LONG TERM GOALS: Target date: 04/26/2022    Patient will be independent with ongoing/advanced HEP for self-management at home.  Baseline:  Goal status: MET  04/29/22  2.  Patient will report 75% improvement in  low back pain & L LE radicular pain to improve QOL.  Baseline:  Goal status: MET  04/29/22 - Pt reports 90% improvement  3.  Patient to demonstrate ability to achieve and maintain good spinal alignment/posturing and body mechanics needed for daily activities. Baseline:  Goal status: MET   04/29/22   4.  Patient will demonstrate full pain free lumbar ROM to perform ADLs.   Baseline:  Goal status: PARTIALLY MET  04/29/22 - Pain only with L side-bending  5.  Patient will demonstrate improved B LE strength to >/= 4+/5 for improved stability and ease of mobility . Baseline:  Goal status: PARTIALLY MET  04/29/22 - Met except L hip flexion, adduction & ER 4/5  6.  Patient will report 54 on lumbar FOTO to demonstrate improved functional ability.  Baseline: Lumbar FOTO = 42 Goal status: MET  04/29/22 - Lumbar FOTO = 65   7.  Patient to report ability to perform ADLs, household, leisure and work-related tasks without increased pain. Baseline:  Goal status: MET  04/29/22    PLAN: PT FREQUENCY: 2x/week  PT DURATION: 6 weeks  PLANNED INTERVENTIONS: Therapeutic exercises, Therapeutic activity, Neuromuscular re-education, Balance training, Gait training, Patient/Family education, Self Care, Joint mobilization, Dry Needling, Electrical stimulation, Spinal mobilization, Cryotherapy, Moist heat, Taping, Traction, Ultrasound, Ionotophoresis 74m/ml Dexamethasone, Manual therapy, and Re-evaluation.  PLAN FOR NEXT SESSION:  transition to HEP + 30-day hold   JPercival Spanish PT 04/29/2022, 2:47 PM  PHYSICAL THERAPY DISCHARGE SUMMARY  Visits from Start of Care: 10  Current functional level related to goals / functional outcomes:   Refer to above clinical impression and goal assessment for status as of last visit on 04/29/2022. Patient was placed on hold for 30 days and has not needed to return to PT, therefore will proceed with discharge from PT for this episode.    Remaining deficits:   As above.   Education / Equipment:   HEP   Patient agrees to discharge. Patient goals were mostly met. Patient is being discharged due to being pleased with the current functional level.  JPercival Spanish PT, MPT 06/22/22, 11:54 AM  [facility] [address]Phone: [phone]  Fax:  [phone]

## 2022-04-30 ENCOUNTER — Ambulatory Visit
Admission: RE | Admit: 2022-04-30 | Discharge: 2022-04-30 | Disposition: A | Discharge status: Home / Self Care | Payer: Commercial Managed Care - HMO | Source: Ambulatory Visit | Attending: Surgery | Admitting: Surgery

## 2022-04-30 DIAGNOSIS — M4726 Other spondylosis with radiculopathy, lumbar region: Secondary | ICD-10-CM

## 2022-05-05 ENCOUNTER — Ambulatory Visit: Payer: Commercial Managed Care - HMO | Admitting: Orthopaedic Surgery

## 2022-05-05 ENCOUNTER — Encounter: Payer: Self-pay | Admitting: Orthopaedic Surgery

## 2022-05-05 VITALS — BP 159/95 | HR 89 | Ht 60.0 in | Wt 138.0 lb

## 2022-05-05 DIAGNOSIS — G8929 Other chronic pain: Secondary | ICD-10-CM

## 2022-05-05 DIAGNOSIS — M5442 Lumbago with sciatica, left side: Secondary | ICD-10-CM | POA: Diagnosis not present

## 2022-05-05 NOTE — Progress Notes (Signed)
Office Visit Note   Patient: Carol Ho           Date of Birth: 01-16-1966           MRN: [id number] Visit Date: 05/05/2022              Requested by: Charyl Dancer, NP [place],  [place] [address] PCP: Charyl Dancer, NP   Assessment & Plan: Visit Diagnoses:  1. Chronic left-sided low back pain with left-sided sciatica     Plan: MRI scan and report reviewed.  Narrowing at L3-4 and L4-5 centrally and foraminal.  She is not having neurogenic claudication symptoms at this point.  She can continue walking program continue ibuprofen.  Follow-Up Instructions: Return if symptoms worsen or fail to improve.   Orders:  No orders of the defined types were placed in this encounter.  No orders of the defined types were placed in this encounter.     Procedures: No procedures performed   Clinical Data: No additional findings.   Subjective: Chief Complaint  Patient presents with   Lower Back - Pain, Follow-up    MRI lumbar review    HPI 56 year old female returns with ongoing low back pain.  Patient states therapy has helped significantly with her symptoms.  Lumbar MRI scan was done 04/30/2022 is available for review.  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  Review of Systems all the systems noncontributory to HPI.   Objective: Vital Signs: BP (!) 159/95   Pulse 89   Ht 5' (1.524 m)   Wt 138 lb (62.6 kg)   BMI 26.95 kg/m   Physical Exam Constitutional:      Appearance: She is well-developed.  HENT:     Head: Normocephalic.     Right Ear: External ear normal.     Left Ear: External ear normal. There is no impacted cerumen.  Eyes:     Pupils: Pupils are equal, round, and reactive to light.  Neck:     Thyroid: No thyromegaly.     Trachea: No tracheal deviation.  Cardiovascular:     Rate and Rhythm: Normal rate.  Pulmonary:     Effort: Pulmonary effort is normal.  Abdominal:     Palpations: Abdomen  is soft.  Musculoskeletal:     Cervical back: No rigidity.  Skin:    General: Skin is warm and dry.  Neurological:     Mental Status: She is alert and oriented to person, place, and time.  Psychiatric:        Behavior: Behavior normal.     Ortho Exam patient gets from sitting standing normal heel-toe gait.  Negative logroll to hips.  Negative straight leg raising 90 degrees.  Some tenderness over the sacroiliac joint and sciatic notch both right and left.  Posterior tib and toe extensors are normal.  Specialty Comments:  No specialty comments available.  Imaging: No results found.   PMFS History: Patient Active Problem List   Diagnosis Date Noted   Tobacco use 02/15/2022   Chronic left-sided low back pain with left-sided sciatica 02/05/2022   Daytime somnolence 01/21/2022   Obstructive sleep apnea (adult) (pediatric) 01/21/2022   Bursitis of right shoulder 01/05/2021   Alcohol use 07/03/2015   Benign essential hypertension 07/03/2015   Discolored skin 07/03/2015   Elbow tendonitis 07/03/2015   Generalized anxiety disorder 07/03/2015   High risk medication use 07/03/2015   Hypothyroidism 07/03/2015   Neck pain 07/03/2015  Tremor of left hand 07/03/2015   Weakness of left upper extremity 07/03/2015   Past Medical History:  Diagnosis Date   Hypertension    Thyroid disease     Family History  Problem Relation Age of Onset   Hypertension Mother    Healthy Father    Healthy Brother     Past Surgical History:  Procedure Laterality Date   ELBOW SURGERY     TONSILLECTOMY     Social History   Occupational History   Not on file  Tobacco Use   Smoking status: Every Day    Packs/day: 0.75    Years: 20.00    Total pack years: 15.00    Types: Cigarettes   Smokeless tobacco: Never  Vaping Use   Vaping Use: Never used  Substance and Sexual Activity   Alcohol use: Yes    Alcohol/week: 12.0 standard drinks of alcohol    Types: 12 Cans of beer per week   Drug  use: Never   Sexual activity: Yes    Birth control/protection: Post-menopausal

## 2022-05-18 ENCOUNTER — Encounter: Payer: Self-pay | Admitting: Nurse Practitioner

## 2022-05-18 MED ORDER — LISINOPRIL 20 MG PO TABS: 10.0000 mg | ORAL_TABLET | Freq: Every morning | ORAL | 1 refills | Status: DC

## 2022-06-10 MED ORDER — LEVOTHYROXINE SODIUM 88 MCG PO TABS: 88.0000 ug | ORAL_TABLET | Freq: Every day | ORAL | 1 refills | Status: DC

## 2022-06-10 MED ORDER — IBUPROFEN 600 MG PO TABS: 600.0000 mg | ORAL_TABLET | Freq: Three times a day (TID) | ORAL | 1 refills | Status: DC | PRN

## 2022-06-10 NOTE — Addendum Note (Signed)
Addended by: Rodman Pickle A on: 06/10/2022 09:17 AM   Modules accepted: Orders

## 2022-07-30 ENCOUNTER — Telehealth: Payer: Self-pay

## 2022-07-30 NOTE — Telephone Encounter (Signed)
LVM for pt to call the office to schedule the Mobile Mammo Unit.

## 2022-10-10 NOTE — Progress Notes (Deleted)
   Established Patient Office Visit  Subjective   Patient ID: Carol Ho, female    DOB: 12/23/1965  Age: 57 y.o. MRN: [id number]  No chief complaint on file.   HPI  {History (Optional):23778}  ROS    Objective:     There were no vitals taken for this visit. {Vitals History (Optional):23777}  Physical Exam   No results found for any visits on 10/11/22.  {Labs (Optional):23779}  The 10-year ASCVD risk score (Arnett DK, et al., 2019) is: 9.4%    Assessment & Plan:   Problem List Items Addressed This Visit   None   No follow-ups on file.    Gerre Scull, NP

## 2022-10-11 ENCOUNTER — Ambulatory Visit: Payer: Commercial Managed Care - HMO | Admitting: Nurse Practitioner

## 2022-10-11 ENCOUNTER — Telehealth: Payer: Self-pay | Admitting: Nurse Practitioner

## 2022-10-11 DIAGNOSIS — F411 Generalized anxiety disorder: Secondary | ICD-10-CM

## 2022-10-11 DIAGNOSIS — E039 Hypothyroidism, unspecified: Secondary | ICD-10-CM

## 2022-10-11 DIAGNOSIS — I10 Essential (primary) hypertension: Secondary | ICD-10-CM

## 2022-10-11 NOTE — Telephone Encounter (Signed)
Patient/Caregiver was notified of No Show/Late Cancellation Policy & possible $50 charge. Visit was cancelled with reason "No Show/Cancel within 24 hours" for tracking & charging.  Caller Name: Kili Esquilin Ph #: [phone] Date of APPT: 4/8 Reason given for no show/late cancellation: Got stuck at work in [place] and can't walk off the job. No Show Letter printed & put in outgoing mail (Yes/No): yes

## 2022-10-11 NOTE — Telephone Encounter (Signed)
Noted  

## 2022-10-11 NOTE — Telephone Encounter (Signed)
1st same day cancel, fee waived, letter sent to reschedule/notify of policy

## 2022-10-25 ENCOUNTER — Encounter: Payer: Commercial Managed Care - HMO | Admitting: Nurse Practitioner

## 2022-10-28 ENCOUNTER — Encounter: Payer: Commercial Managed Care - HMO | Admitting: Nurse Practitioner

## 2022-11-03 ENCOUNTER — Ambulatory Visit (INDEPENDENT_AMBULATORY_CARE_PROVIDER_SITE_OTHER): Payer: Commercial Managed Care - HMO | Admitting: Nurse Practitioner

## 2022-11-03 ENCOUNTER — Encounter: Payer: Self-pay | Admitting: Nurse Practitioner

## 2022-11-03 VITALS — BP 136/78 | HR 70 | Temp 97.3°F | Ht 60.0 in | Wt 146.2 lb

## 2022-11-03 DIAGNOSIS — I10 Essential (primary) hypertension: Secondary | ICD-10-CM

## 2022-11-03 DIAGNOSIS — E039 Hypothyroidism, unspecified: Secondary | ICD-10-CM

## 2022-11-03 DIAGNOSIS — G8929 Other chronic pain: Secondary | ICD-10-CM

## 2022-11-03 DIAGNOSIS — Z720 Tobacco use: Secondary | ICD-10-CM

## 2022-11-03 DIAGNOSIS — Z1283 Encounter for screening for malignant neoplasm of skin: Secondary | ICD-10-CM

## 2022-11-03 DIAGNOSIS — Z0000 Encounter for general adult medical examination without abnormal findings: Secondary | ICD-10-CM

## 2022-11-03 DIAGNOSIS — M5442 Lumbago with sciatica, left side: Secondary | ICD-10-CM

## 2022-11-03 DIAGNOSIS — G4733 Obstructive sleep apnea (adult) (pediatric): Secondary | ICD-10-CM

## 2022-11-03 NOTE — Assessment & Plan Note (Signed)
Chronic, stable. Continue levothyroxine 88mcg daily.  Check TSH today 

## 2022-11-03 NOTE — Progress Notes (Signed)
BP 136/78 (BP Location: Left Arm, Cuff Size: Normal)   Pulse 70   Temp (!) 97.3 F (36.3 C)   Ht 5' (1.524 m)   Wt 146 lb 3.2 oz (66.3 kg)   SpO2 97%   BMI 28.55 kg/m    Subjective:    Patient ID: Carol Ho, female    DOB: 1966-02-17, 57 y.o.   MRN: [id number]  CC: Chief Complaint  Patient presents with   Annual Exam    With lab work-has no taken B/P medication    HPI: Carol Ho is a 57 y.o. female presenting on 11/03/2022 for comprehensive medical examination. Current medical complaints include:none  Depression and Anxiety Screen done today and results listed below:     11/03/2022    4:12 PM 02/05/2022   10:06 AM  Depression screen PHQ 2/9  Decreased Interest 1 2  Down, Depressed, Hopeless 1 1  PHQ - 2 Score 2 3  Altered sleeping 0 2  Tired, decreased energy 1 2  Change in appetite 0 2  Feeling bad or failure about yourself  1 2  Trouble concentrating 0 1  Moving slowly or fidgety/restless 1 2  Suicidal thoughts 0 0  PHQ-9 Score 5 14  Difficult doing work/chores  Somewhat difficult      11/03/2022    4:13 PM 02/05/2022   10:07 AM  GAD 7 : Generalized Anxiety Score  Nervous, Anxious, on Edge 0 2  Control/stop worrying 1 1  Worry too much - different things 1 3  Trouble relaxing 0 2  Restless 2 2  Easily annoyed or irritable 2 2  Afraid - awful might happen 1 3  Total GAD 7 Score 7 15  Anxiety Difficulty  Somewhat difficult    The patient does not have a history of falls. I did not complete a risk assessment for falls. A plan of care for falls was not documented.   Past Medical History:  Past Medical History:  Diagnosis Date   Hypertension    Thyroid disease     Surgical History:  Past Surgical History:  Procedure Laterality Date   ELBOW SURGERY     TONSILLECTOMY      Medications:  Current Outpatient Medications on File Prior to Visit  Medication Sig   cyclobenzaprine (FLEXERIL) 5 MG tablet Take 1 tablet (5 mg total) by mouth 2 (two)  times daily as needed for muscle spasms.   hydrochlorothiazide (HYDRODIURIL) 25 MG tablet Take 25 mg by mouth daily.   ibuprofen (ADVIL) 600 MG tablet Take 1 tablet (600 mg total) by mouth every 8 (eight) hours as needed.   levothyroxine (SYNTHROID) 88 MCG tablet Take 1 tablet (88 mcg total) by mouth daily before breakfast.   lisinopril (ZESTRIL) 20 MG tablet Take 0.5-1 tablets (10-20 mg total) by mouth every morning.   No current facility-administered medications on file prior to visit.    Allergies:  Allergies  Allergen Reactions   Darvon [Propoxyphene Hcl] Hives    Social History:  Social History   Socioeconomic History   Marital status: Widowed    Spouse name: Not on file   Number of children: Not on file   Years of education: Not on file   Highest education level: Not on file  Occupational History   Not on file  Tobacco Use   Smoking status: Every Day    Packs/day: 0.75    Years: 20.00    Additional pack years: 0.00    Total pack years: 15.00  Types: Cigarettes   Smokeless tobacco: Never  Vaping Use   Vaping Use: Never used  Substance and Sexual Activity   Alcohol use: Yes    Alcohol/week: 12.0 standard drinks of alcohol    Types: 12 Cans of beer per week   Drug use: Never   Sexual activity: Yes    Birth control/protection: Post-menopausal  Other Topics Concern   Not on file  Social History Narrative   Not on file   Social Determinants of Health   Financial Resource Strain: Not on file  Food Insecurity: Not on file  Transportation Needs: Not on file  Physical Activity: Not on file  Stress: Not on file  Social Connections: Not on file  Intimate Partner Violence: Not on file   Social History   Tobacco Use  Smoking Status Every Day   Packs/day: 0.75   Years: 20.00   Additional pack years: 0.00   Total pack years: 15.00   Types: Cigarettes  Smokeless Tobacco Never   Social History   Substance and Sexual Activity  Alcohol Use Yes    Alcohol/week: 12.0 standard drinks of alcohol   Types: 12 Cans of beer per week    Family History:  Family History  Problem Relation Age of Onset   Hypertension Mother    Healthy Father    Healthy Brother     Past medical history, surgical history, medications, allergies, family history and social history reviewed with patient today and changes made to appropriate areas of the chart.   Review of Systems  Constitutional: Negative.   HENT: Negative.    Eyes: Negative.   Respiratory: Negative.    Cardiovascular: Negative.   Gastrointestinal: Negative.   Genitourinary: Negative.   Musculoskeletal: Negative.   Skin: Negative.   Neurological: Negative.   Psychiatric/Behavioral: Negative.     All other ROS negative except what is listed above and in the HPI.      Objective:    BP 136/78 (BP Location: Left Arm, Cuff Size: Normal)   Pulse 70   Temp (!) 97.3 F (36.3 C)   Ht 5' (1.524 m)   Wt 146 lb 3.2 oz (66.3 kg)   SpO2 97%   BMI 28.55 kg/m   Wt Readings from Last 3 Encounters:  11/03/22 146 lb 3.2 oz (66.3 kg)  05/05/22 138 lb (62.6 kg)  04/01/22 145 lb (65.8 kg)    Physical Exam Vitals and nursing note reviewed.  Constitutional:      General: She is not in acute distress.    Appearance: Normal appearance.  HENT:     Head: Normocephalic and atraumatic.     Right Ear: Tympanic membrane, ear canal and external ear normal.     Left Ear: Tympanic membrane, ear canal and external ear normal.  Eyes:     Conjunctiva/sclera: Conjunctivae normal.  Cardiovascular:     Rate and Rhythm: Normal rate and regular rhythm.     Pulses: Normal pulses.     Heart sounds: Normal heart sounds.  Pulmonary:     Effort: Pulmonary effort is normal.     Breath sounds: Normal breath sounds.  Abdominal:     Palpations: Abdomen is soft.     Tenderness: There is no abdominal tenderness.  Musculoskeletal:        General: Normal range of motion.     Cervical back: Normal range of motion  and neck supple.     Right lower leg: No edema.     Left lower leg: No  edema.  Lymphadenopathy:     Cervical: No cervical adenopathy.  Skin:    General: Skin is warm and dry.  Neurological:     General: No focal deficit present.     Mental Status: She is alert and oriented to person, place, and time.     Cranial Nerves: No cranial nerve deficit.     Coordination: Coordination normal.     Gait: Gait normal.  Psychiatric:        Mood and Affect: Mood normal.        Behavior: Behavior normal.        Thought Content: Thought content normal.        Judgment: Judgment normal.     Results for orders placed or performed in visit on 02/15/22  CBC  Result Value Ref Range   WBC 9.2 4.0 - 10.5 K/uL   RBC 4.27 3.87 - 5.11 Mil/uL   Platelets 260.0 150.0 - 400.0 K/uL   Hemoglobin 14.9 12.0 - 15.0 g/dL   HCT 60.4 54.0 - 98.1 %   MCV 101.1 (H) 78.0 - 100.0 fl   MCHC 34.5 30.0 - 36.0 g/dL   RDW 19.1 47.8 - 29.5 %  Comprehensive metabolic panel  Result Value Ref Range   Sodium 132 (L) 135 - 145 mEq/L   Potassium 3.7 3.5 - 5.1 mEq/L   Chloride 94 (L) 96 - 112 mEq/L   CO2 27 19 - 32 mEq/L   Glucose, Bld 94 70 - 99 mg/dL   BUN 15 6 - 23 mg/dL   Creatinine, Ser 6.21 0.40 - 1.20 mg/dL   Total Bilirubin 0.3 0.2 - 1.2 mg/dL   Alkaline Phosphatase 59 39 - 117 U/L   AST 17 0 - 37 U/L   ALT 13 0 - 35 U/L   Total Protein 7.1 6.0 - 8.3 g/dL   Albumin 4.7 3.5 - 5.2 g/dL   GFR 30.86 >57.84 mL/min   Calcium 9.7 8.4 - 10.5 mg/dL  TSH  Result Value Ref Range   TSH 0.63 0.35 - 5.50 uIU/mL  Lipid panel  Result Value Ref Range   Cholesterol 207 (H) 0 - 200 mg/dL   Triglycerides 696.2 0.0 - 149.0 mg/dL   HDL 95.28 >41.32 mg/dL   VLDL 44.0 0.0 - 10.2 mg/dL   LDL Cholesterol 93 0 - 99 mg/dL   Total CHOL/HDL Ratio 2    NonHDL 116.81       Assessment & Plan:   Problem List Items Addressed This Visit       Cardiovascular and Mediastinum   Benign essential hypertension    Chronic, stable.   Continue lisinopril 20 mg daily and HCTZ 25 mg daily.  Check CMP, CBC, lipid panel today.  Follow-up 1 year.      Relevant Orders   CBC   Comprehensive metabolic panel   Lipid panel     Respiratory   Obstructive sleep apnea (adult) (pediatric)    Chronic, stable.  Continue CPAP at night        Endocrine   Hypothyroidism    Chronic, stable.  Continue levothyroxine 88 mcg daily.  Check TSH today.      Relevant Orders   TSH     Nervous and Auditory   Chronic left-sided low back pain with left-sided sciatica    Chronic, stable.  Pain has significantly improved since doing physical therapy.  She states that her back has gotten slightly stiff after taking a flight to [facility], in February.  She will start doing her stretches again daily.      Relevant Orders   Vitamin B12     Other   Tobacco use    Currently smoking about three quarters a pack a day.  Encourage complete tobacco cessation. Declines pneumonia vaccine today.       Routine general medical examination at a health care facility - Primary    Health maintenance reviewed and updated. Discussed nutrition, exercise. Check CMP, CBC today. Discussed mammogram, and she will look at her schedule and see if she can arrange a time for the mobile mammogram at our office. She will also schedule a nurse visit for the shingles vaccines. Follow-up 1 year.        Other Visit Diagnoses     Skin cancer screening       Referral placed to dermatology per request. She works outside as Agricultural engineer. Discussed sunscreen use.   Relevant Orders   Ambulatory referral to Dermatology        Follow up plan: Return in about 1 year (around 11/03/2023) for CPE.   LABORATORY TESTING:  - Pap smear: up to date  IMMUNIZATIONS:   - Tdap: Tetanus vaccination status reviewed: last tetanus booster within 10 years. - Influenza: Postponed to flu season - Pneumovax: Not applicable - Prevnar:  declines today - HPV: Not applicable - Zostavax  vaccine:  will schedule  SCREENING: -Mammogram:  will schedule    - Colonoscopy: Up to date  - Bone Density: Not applicable   PATIENT COUNSELING:   Advised to take 1 mg of folate supplement per day if capable of pregnancy.   Sexuality: Discussed sexually transmitted diseases, partner selection, use of condoms, avoidance of unintended pregnancy  and contraceptive alternatives.   Advised to avoid cigarette smoking.  I discussed with the patient that most people either abstain from alcohol or drink within safe limits (<=14/week and <=4 drinks/occasion for males, <=7/weeks and <= 3 drinks/occasion for females) and that the risk for alcohol disorders and other health effects rises proportionally with the number of drinks per week and how often a drinker exceeds daily limits.  Discussed cessation/primary prevention of drug use and availability of treatment for abuse.   Diet: Encouraged to adjust caloric intake to maintain  or achieve ideal body weight, to reduce intake of dietary saturated fat and total fat, to limit sodium intake by avoiding high sodium foods and not adding table salt, and to maintain adequate dietary potassium and calcium preferably from fresh fruits, vegetables, and low-fat dairy products.    stressed the importance of regular exercise  Injury prevention: Discussed safety belts, safety helmets, smoke detector, smoking near bedding or upholstery.   Dental health: Discussed importance of regular tooth brushing, flossing, and dental visits.    NEXT PREVENTATIVE PHYSICAL DUE IN 1 YEAR. Return in about 1 year (around 11/03/2023) for CPE.

## 2022-11-03 NOTE — Assessment & Plan Note (Signed)
Chronic, stable. Continue CPAP at night.  

## 2022-11-03 NOTE — Assessment & Plan Note (Signed)
Chronic, stable.  Continue lisinopril 20 mg daily and HCTZ 25 mg daily.  Check CMP, CBC, lipid panel today.  Follow-up 1 year.

## 2022-11-03 NOTE — Assessment & Plan Note (Addendum)
Currently smoking about three quarters a pack a day.  Encourage complete tobacco cessation. Declines pneumonia vaccine today.

## 2022-11-03 NOTE — Patient Instructions (Signed)
It was great to see you!  We are checking your labs today and will let you know the results via mychart/phone.   I have placed a referral to dermatology.   I have placed an order for screening mammogram, they should call you to schedule. If you do not hear from them in the next week, please call:  [facility] [address] [place], [address] [place], [place] [phone]  Let's follow-up in 1 year, sooner if you have concerns.  If a referral was placed today, you will be contacted for an appointment. Please note that routine referrals can sometimes take up to 3-4 weeks to process. Please call our office if you haven't heard anything after this time frame.  Take care,  Rodman Pickle, NP

## 2022-11-03 NOTE — Assessment & Plan Note (Signed)
Health maintenance reviewed and updated. Discussed nutrition, exercise. Check CMP, CBC today. Discussed mammogram, and she will look at her schedule and see if she can arrange a time for the mobile mammogram at our office. She will also schedule a nurse visit for the shingles vaccines. Follow-up 1 year.

## 2022-11-03 NOTE — Assessment & Plan Note (Signed)
Chronic, stable.  Pain has significantly improved since doing physical therapy.  She states that her back has gotten slightly stiff after taking a flight to [facility], in February.  She will start doing her stretches again daily.

## 2022-11-04 ENCOUNTER — Ambulatory Visit (INDEPENDENT_AMBULATORY_CARE_PROVIDER_SITE_OTHER): Payer: Commercial Managed Care - HMO

## 2022-11-04 DIAGNOSIS — Z23 Encounter for immunization: Secondary | ICD-10-CM | POA: Diagnosis not present

## 2022-11-04 LAB — CBC
HCT: 44.9 % (ref 36.0–46.0)
Hemoglobin: 15.3 g/dL — ABNORMAL HIGH (ref 12.0–15.0)
MCHC: 34 g/dL (ref 30.0–36.0)
MCV: 101.9 fl — ABNORMAL HIGH (ref 78.0–100.0)
Platelets: 252 10*3/uL (ref 150.0–400.0)
RBC: 4.41 Mil/uL (ref 3.87–5.11)
RDW: 13.1 % (ref 11.5–15.5)
WBC: 6.2 10*3/uL (ref 4.0–10.5)

## 2022-11-04 LAB — COMPREHENSIVE METABOLIC PANEL
ALT: 14 U/L (ref 0–35)
AST: 19 U/L (ref 0–37)
Albumin: 4.5 g/dL (ref 3.5–5.2)
Alkaline Phosphatase: 54 U/L (ref 39–117)
BUN: 18 mg/dL (ref 6–23)
CO2: 29 mEq/L (ref 19–32)
Calcium: 10 mg/dL (ref 8.4–10.5)
Chloride: 95 mEq/L — ABNORMAL LOW (ref 96–112)
Creatinine, Ser: 0.74 mg/dL (ref 0.40–1.20)
GFR: 88.65 mL/min (ref >60.00)
Glucose, Bld: 104 mg/dL — ABNORMAL HIGH (ref 70–99)
Potassium: 3.4 mEq/L — ABNORMAL LOW (ref 3.5–5.1)
Sodium: 132 mEq/L — ABNORMAL LOW (ref 135–145)
Total Bilirubin: 0.5 mg/dL (ref 0.2–1.2)
Total Protein: 6.9 g/dL (ref 6.0–8.3)

## 2022-11-04 LAB — LIPID PANEL
Cholesterol: 211 mg/dL — ABNORMAL HIGH (ref 0–200)
HDL: 101.7 mg/dL (ref >39.00)
LDL Cholesterol: 101 mg/dL — ABNORMAL HIGH (ref 0–99)
NonHDL: 109.23
Total CHOL/HDL Ratio: 2
Triglycerides: 42 mg/dL (ref 0.0–149.0)
VLDL: 8.4 mg/dL (ref 0.0–40.0)

## 2022-11-04 LAB — TSH: TSH: 0.64 u[IU]/mL (ref 0.35–5.50)

## 2022-11-04 LAB — VITAMIN B12: Vitamin B-12: 222 pg/mL (ref 211–911)

## 2022-11-04 NOTE — Progress Notes (Signed)
Per orders of Lauren McElwee, injection of shingles vaccine given by Mickle Plumb, cma.  Patient tolerated injection well.  Will return in 3 months for 2nd one. Dm/cma

## 2022-12-05 ENCOUNTER — Other Ambulatory Visit: Payer: Self-pay | Admitting: Nurse Practitioner

## 2022-12-06 NOTE — Telephone Encounter (Signed)
Requesting: Levothyroxine Sodium 88 MCG Oral Tablet  Last Visit: 11/03/2022 Next Visit: Visit date not found Last Refill: 06/10/2022  Please Advise

## 2023-03-21 ENCOUNTER — Telehealth: Payer: Self-pay | Admitting: Nurse Practitioner

## 2023-03-21 DIAGNOSIS — Z23 Encounter for immunization: Secondary | ICD-10-CM

## 2023-03-21 NOTE — Telephone Encounter (Signed)
I scheduled pt a nurse visit appt for a shingrix injection, she requested through mychart.

## 2023-03-24 ENCOUNTER — Ambulatory Visit: Payer: Commercial Managed Care - HMO

## 2023-03-29 ENCOUNTER — Ambulatory Visit (INDEPENDENT_AMBULATORY_CARE_PROVIDER_SITE_OTHER): Payer: Commercial Managed Care - HMO

## 2023-03-29 DIAGNOSIS — Z23 Encounter for immunization: Secondary | ICD-10-CM | POA: Diagnosis not present

## 2023-03-29 NOTE — Progress Notes (Signed)
Patient presents for her second Shingrix Vaccine injection. Injection placed in left deltoid region by Malena Peer, CMA. Patient tolerated procedure well with no concerns.

## 2023-04-06 ENCOUNTER — Other Ambulatory Visit: Payer: Self-pay | Admitting: Nurse Practitioner

## 2023-04-06 DIAGNOSIS — Z1231 Encounter for screening mammogram for malignant neoplasm of breast: Secondary | ICD-10-CM

## 2023-05-05 ENCOUNTER — Ambulatory Visit
Admission: RE | Admit: 2023-05-05 | Discharge: 2023-05-05 | Disposition: A | Discharge status: Home / Self Care | Payer: Managed Care, Other (non HMO) | Source: Ambulatory Visit | Attending: Nurse Practitioner | Admitting: Nurse Practitioner

## 2023-05-05 DIAGNOSIS — Z1231 Encounter for screening mammogram for malignant neoplasm of breast: Secondary | ICD-10-CM

## 2023-05-17 ENCOUNTER — Other Ambulatory Visit: Payer: Self-pay | Admitting: Nurse Practitioner

## 2023-05-18 NOTE — Telephone Encounter (Signed)
Requesting: Ibuprofen 600 MG Oral Tablet  Last Visit: 11/03/2022 Next Visit: Visit date not found Last Refill: 06/10/2022  Please Advise

## 2023-05-25 ENCOUNTER — Ambulatory Visit: Payer: Commercial Managed Care - HMO | Admitting: Dermatology

## 2023-05-25 ENCOUNTER — Encounter: Payer: Self-pay | Admitting: Dermatology

## 2023-05-25 VITALS — BP 151/93

## 2023-05-25 DIAGNOSIS — D229 Melanocytic nevi, unspecified: Secondary | ICD-10-CM

## 2023-05-25 DIAGNOSIS — D1801 Hemangioma of skin and subcutaneous tissue: Secondary | ICD-10-CM

## 2023-05-25 DIAGNOSIS — L570 Actinic keratosis: Secondary | ICD-10-CM | POA: Diagnosis not present

## 2023-05-25 DIAGNOSIS — L409 Psoriasis, unspecified: Secondary | ICD-10-CM

## 2023-05-25 DIAGNOSIS — Z1283 Encounter for screening for malignant neoplasm of skin: Secondary | ICD-10-CM

## 2023-05-25 DIAGNOSIS — W908XXA Exposure to other nonionizing radiation, initial encounter: Secondary | ICD-10-CM | POA: Diagnosis not present

## 2023-05-25 DIAGNOSIS — L821 Other seborrheic keratosis: Secondary | ICD-10-CM

## 2023-05-25 DIAGNOSIS — L814 Other melanin hyperpigmentation: Secondary | ICD-10-CM

## 2023-05-25 DIAGNOSIS — L578 Other skin changes due to chronic exposure to nonionizing radiation: Secondary | ICD-10-CM

## 2023-05-25 MED ORDER — TRIAMCINOLONE ACETONIDE 0.1 % EX CREA: 1.0000 | TOPICAL_CREAM | Freq: Two times a day (BID) | CUTANEOUS | 1 refills | Status: DC

## 2023-05-25 NOTE — Progress Notes (Deleted)
   New Patient Visit   Subjective  Carol Ho is a 57 y.o. female who presents for the following: Skin Cancer Screening and Full Body Skin Exam  The patient presents for Total-Body Skin Exam (TBSE) for skin cancer screening and mole check. The patient has spots, moles and lesions to be evaluated, some may be new or changing and the patient may have concern these could be cancer.    The following portions of the chart were reviewed this encounter and updated as appropriate: medications, allergies, medical history  Review of Systems:  No other skin or systemic complaints except as noted in HPI or Assessment and Plan.  Objective  Well appearing patient in no apparent distress; mood and affect are within normal limits.  A full examination was performed including scalp, head, eyes, ears, nose, lips, neck, chest, axillae, abdomen, back, buttocks, bilateral upper extremities, bilateral lower extremities, hands, feet, fingers, toes, fingernails, and toenails. All findings within normal limits unless otherwise noted below.   Relevant physical exam findings are noted in the Assessment and Plan.    Assessment & Plan   SKIN CANCER SCREENING PERFORMED TODAY.  ACTINIC DAMAGE - Chronic condition, secondary to cumulative UV/sun exposure - diffuse scaly erythematous macules with underlying dyspigmentation - Recommend daily broad spectrum sunscreen SPF 30+ to sun-exposed areas, reapply every 2 hours as needed.  - Staying in the shade or wearing long sleeves, sun glasses (UVA+UVB protection) and wide brim hats (4-inch brim around the entire circumference of the hat) are also recommended for sun protection.  - Call for new or changing lesions.  LENTIGINES, SEBORRHEIC KERATOSES, HEMANGIOMAS - Benign normal skin lesions - Benign-appearing - Call for any changes  MELANOCYTIC NEVI - Tan-brown and/or pink-flesh-colored symmetric macules and papules - Benign appearing on exam today -  Observation - Call clinic for new or changing moles - Recommend daily use of broad spectrum spf 30+ sunscreen to sun-exposed areas.          No follow-ups on file.  I, Tillie Fantasia, CMA, am acting as scribe for Gwenith Daily, MD.   Documentation: I have reviewed the above documentation for accuracy and completeness, and I agree with the above.  Gwenith Daily, MD

## 2023-05-25 NOTE — Patient Instructions (Signed)

## 2023-05-25 NOTE — Progress Notes (Signed)
   New Patient Visit   Subjective  Carol Ho is a 57 y.o. female who presents for the following: Skin Cancer Screening and Full Body Skin Exam - She has some spots on her face that she would like checked. No history of skin cancer. No family history of Melanoma.  The patient presents for Total-Body Skin Exam (TBSE) for skin cancer screening and mole check. The patient has spots, moles and lesions to be evaluated, some may be new or changing.  The following portions of the chart were reviewed this encounter and updated as appropriate: medications, allergies, medical history  Review of Systems:  No other skin or systemic complaints except as noted in HPI or Assessment and Plan.  Objective  Well appearing patient in no apparent distress; mood and affect are within normal limits.  A full examination was performed including scalp, head, eyes, ears, nose, lips, neck, chest, axillae, abdomen, back, buttocks, bilateral upper extremities, bilateral lower extremities, hands, feet, fingers, toes, fingernails, and toenails. All findings within normal limits unless otherwise noted below.   Relevant physical exam findings are noted in the Assessment and Plan.  Right Lower Leg - Anterior Erythematous thin papules/macules with gritty scale.    Assessment & Plan   SKIN CANCER SCREENING PERFORMED TODAY.  ACTINIC DAMAGE - Chronic condition, secondary to cumulative UV/sun exposure - diffuse scaly erythematous macules with underlying dyspigmentation - Recommend daily broad spectrum sunscreen SPF 30+ to sun-exposed areas, reapply every 2 hours as needed.  - Staying in the shade or wearing long sleeves, sun glasses (UVA+UVB protection) and wide brim hats (4-inch brim around the entire circumference of the hat) are also recommended for sun protection.  - Call for new or changing lesions.  LENTIGINES, SEBORRHEIC KERATOSES, HEMANGIOMAS - Benign normal skin lesions - Benign-appearing - Call for any  changes  MELANOCYTIC NEVI - Tan-brown and/or pink-flesh-colored symmetric macules and papules - Benign appearing on exam today - Observation - Call clinic for new or changing moles - Recommend daily use of broad spectrum spf 30+ sunscreen to sun-exposed areas.   PSORIASIS- Flared Exam: Well-demarcated erythematous papules/plaques with silvery scale, guttate pink scaly papules. 5-10% BSA.  Psoriasis is a chronic non-curable, but treatable genetic/hereditary disease that may have other systemic features affecting other organ systems such as joints (Psoriatic Arthritis). It is associated with an increased risk of inflammatory bowel disease, heart disease, non-alcoholic fatty liver disease, and depression.  Treatments include light and laser treatments; topical medications; and systemic medications including oral and injectables.  Treatment Plan:  TMC 0.1% cream bid to affected areas of back and abdomen as needed   Will plan biopsy if not improving.   AK (actinic keratosis) Right Lower Leg - Anterior  Destruction of lesion - Right Lower Leg - Anterior Complexity: simple   Destruction method: cryotherapy   Informed consent: discussed and consent obtained   Timeout:  patient name, date of birth, surgical site, and procedure verified Lesion destroyed using liquid nitrogen: Yes   Region frozen until ice ball extended beyond lesion: Yes   Outcome: patient tolerated procedure well with no complications   Post-procedure details: wound care instructions given     Return in about 2 months (around 07/25/2023) for Psoriasis.  I, Joanie Coddington, CMA, am acting as scribe for Gwenith Daily, MD .   Documentation: I have reviewed the above documentation for accuracy and completeness, and I agree with the above.  Gwenith Daily, MD

## 2023-07-18 ENCOUNTER — Other Ambulatory Visit: Payer: Self-pay | Admitting: Dermatology

## 2023-07-21 ENCOUNTER — Other Ambulatory Visit: Payer: Self-pay

## 2023-07-21 MED ORDER — TRIAMCINOLONE ACETONIDE 0.1 % EX CREA: TOPICAL_CREAM | CUTANEOUS | 0 refills | Status: AC

## 2023-07-21 NOTE — Progress Notes (Signed)
Refill request

## 2023-07-26 ENCOUNTER — Ambulatory Visit: Payer: Commercial Managed Care - HMO | Admitting: Dermatology

## 2023-08-14 ENCOUNTER — Encounter: Payer: Self-pay | Admitting: Nurse Practitioner

## 2023-08-14 ENCOUNTER — Other Ambulatory Visit: Payer: Self-pay | Admitting: Nurse Practitioner

## 2023-08-15 MED ORDER — LISINOPRIL 20 MG PO TABS
10.0000 mg | ORAL_TABLET | Freq: Every morning | ORAL | 0 refills | Status: DC
Start: 2023-08-15 — End: 2023-11-08

## 2023-08-15 NOTE — Telephone Encounter (Signed)
 Requesting: Lisinopril  20mg  Last Visit: 11/03/2022 Next Visit: Visit date not found Last Refill: 05/18/2022  Please Advise   PATIENT REQUEST A 90 DAY SUPPLY

## 2023-08-16 MED ORDER — IBUPROFEN 600 MG PO TABS: 600.0000 mg | ORAL_TABLET | Freq: Three times a day (TID) | ORAL | 0 refills | Status: DC | PRN

## 2023-10-02 ENCOUNTER — Other Ambulatory Visit: Payer: Self-pay | Admitting: Nurse Practitioner

## 2023-10-03 NOTE — Telephone Encounter (Signed)
 Requesting: Ibuprofen 600 MG Oral Tablet  Last Visit: 11/03/2022 Next Visit: Visit date not found Last Refill: 08/16/2023  Please Advise

## 2023-11-01 ENCOUNTER — Telehealth: Payer: Self-pay

## 2023-11-01 NOTE — Telephone Encounter (Signed)
 Requesting: hydrochlorothiazide 25mg  Last Visit: 11/03/2022 Next Visit: Visit date not found Last Refill: 02/10/2022 by Historical Provider  Please Advise

## 2023-11-02 NOTE — Telephone Encounter (Signed)
 Left message for patient to return call.

## 2023-11-03 NOTE — Telephone Encounter (Signed)
 I called and left patient a detailed message that she needs an office visit and to call the office back and schedule an appointment.

## 2023-11-08 ENCOUNTER — Other Ambulatory Visit: Payer: Self-pay | Admitting: Nurse Practitioner

## 2023-11-08 NOTE — Telephone Encounter (Signed)
 Requesting:  Lisinopril  20 MG Oral Tablet  Last Visit: Visit date not found Next Visit: Visit date not found Last Refill: 08/15/2023  Please Advise    I called and left patient a message to call the office back and schedule an appointment

## 2023-11-26 ENCOUNTER — Other Ambulatory Visit: Payer: Self-pay | Admitting: Nurse Practitioner

## 2023-11-29 NOTE — Telephone Encounter (Signed)
 Requesting: Levothyroxine  Sodium 88 MCG Oral Tablet  Last Visit: 11/03/2022 Next Visit: Visit date not found Last Refill: 12/06/2022  Please Advise

## 2023-12-07 MED ORDER — HYDROCHLOROTHIAZIDE 25 MG PO TABS: 25.0000 mg | ORAL_TABLET | Freq: Every day | ORAL | 0 refills | Status: DC

## 2023-12-07 NOTE — Telephone Encounter (Signed)
 Requesting: hydrochlorothiazide 25mg  Last Visit: 11/03/2022 Next Visit: 12/12/2023 Last Refill: 02/10/2022 by Historical Provider  Please Advise

## 2023-12-07 NOTE — Addendum Note (Signed)
 Addended by: Cartel Mauss A on: 12/07/2023 08:18 AM   Modules accepted: Orders

## 2023-12-10 ENCOUNTER — Other Ambulatory Visit: Payer: Self-pay | Admitting: Nurse Practitioner

## 2023-12-12 ENCOUNTER — Encounter: Payer: Self-pay | Admitting: Nurse Practitioner

## 2023-12-12 ENCOUNTER — Ambulatory Visit (INDEPENDENT_AMBULATORY_CARE_PROVIDER_SITE_OTHER): Admitting: Nurse Practitioner

## 2023-12-12 VITALS — BP 150/80 | HR 59 | Temp 97.4°F | Ht 60.0 in | Wt 147.6 lb

## 2023-12-12 DIAGNOSIS — Z720 Tobacco use: Secondary | ICD-10-CM

## 2023-12-12 DIAGNOSIS — M5442 Lumbago with sciatica, left side: Secondary | ICD-10-CM

## 2023-12-12 DIAGNOSIS — Z0000 Encounter for general adult medical examination without abnormal findings: Secondary | ICD-10-CM | POA: Diagnosis not present

## 2023-12-12 DIAGNOSIS — E039 Hypothyroidism, unspecified: Secondary | ICD-10-CM | POA: Diagnosis not present

## 2023-12-12 DIAGNOSIS — E538 Deficiency of other specified B group vitamins: Secondary | ICD-10-CM

## 2023-12-12 DIAGNOSIS — Z23 Encounter for immunization: Secondary | ICD-10-CM

## 2023-12-12 DIAGNOSIS — Z114 Encounter for screening for human immunodeficiency virus [HIV]: Secondary | ICD-10-CM

## 2023-12-12 DIAGNOSIS — Z1159 Encounter for screening for other viral diseases: Secondary | ICD-10-CM

## 2023-12-12 DIAGNOSIS — G4733 Obstructive sleep apnea (adult) (pediatric): Secondary | ICD-10-CM | POA: Diagnosis not present

## 2023-12-12 DIAGNOSIS — G8929 Other chronic pain: Secondary | ICD-10-CM

## 2023-12-12 DIAGNOSIS — I10 Essential (primary) hypertension: Secondary | ICD-10-CM

## 2023-12-12 DIAGNOSIS — F411 Generalized anxiety disorder: Secondary | ICD-10-CM

## 2023-12-12 NOTE — Assessment & Plan Note (Signed)
Chronic, stable. Continue CPAP at night.  

## 2023-12-12 NOTE — Assessment & Plan Note (Signed)
Chronic, stable. Continue levothyroxine 88mcg daily.  Check TSH today 

## 2023-12-12 NOTE — Assessment & Plan Note (Signed)
 Chronic, not controlled. BP elevated 150/80 today. Increase lisinopril  to 40mg  daily and continue hydrochlorothiazide  25mg  daily. Check CMP, CBC, lipid panel today.

## 2023-12-12 NOTE — Assessment & Plan Note (Signed)
 Chronic, stable. Continue buspar 7.5mg  BID.

## 2023-12-12 NOTE — Progress Notes (Signed)
 BP (!) 150/80 (BP Location: Left Arm, Cuff Size: Normal)   Pulse (!) 59   Temp (!) 97.4 F (36.3 C)   Ht 5' (1.524 m)   Wt 147 lb 9.6 oz (67 kg)   SpO2 97%   BMI 28.83 kg/m    Subjective:    Patient ID: Carol Ho, female    DOB: 16-Jun-1966, 58 y.o.   MRN: [id number]  CC: Chief Complaint  Patient presents with   Annual Exam    With lab work-patient is not fasting, no concerns    HPI: Carol Ho is a 58 y.o. female presenting on 12/12/2023 for comprehensive medical examination. Current medical complaints include:none  She currently lives with: alone Menopausal Symptoms: no  Depression and Anxiety Screen done today and results listed below:     12/12/2023    4:45 PM 11/03/2022    4:12 PM 02/05/2022   10:06 AM  Depression screen PHQ 2/9  Decreased Interest 1 1 2   Down, Depressed, Hopeless 2 1 1   PHQ - 2 Score 3 2 3   Altered sleeping 2 0 2  Tired, decreased energy 1 1 2   Change in appetite 0 0 2  Feeling bad or failure about yourself  2 1 2   Trouble concentrating 0 0 1  Moving slowly or fidgety/restless 0 1 2  Suicidal thoughts 0 0 0  PHQ-9 Score 8 5 14   Difficult doing work/chores Somewhat difficult  Somewhat difficult      12/12/2023    4:45 PM 11/03/2022    4:13 PM 02/05/2022   10:07 AM  GAD 7 : Generalized Anxiety Score  Nervous, Anxious, on Edge 1 0 2  Control/stop worrying 1 1 1   Worry too much - different things 1 1 3   Trouble relaxing 1 0 2  Restless 1 2 2   Easily annoyed or irritable 1 2 2   Afraid - awful might happen 2 1 3   Total GAD 7 Score 8 7 15   Anxiety Difficulty Somewhat difficult  Somewhat difficult    The patient does not have a history of falls. I did not complete a risk assessment for falls. A plan of care for falls was not documented.   Past Medical History:  Past Medical History:  Diagnosis Date   Hypertension    Thyroid  disease     Surgical History:  Past Surgical History:  Procedure Laterality Date   ELBOW SURGERY      TONSILLECTOMY      Medications:  Current Outpatient Medications on File Prior to Visit  Medication Sig   hydrochlorothiazide  (HYDRODIURIL ) 25 MG tablet Take 1 tablet (25 mg total) by mouth daily.   ibuprofen  (ADVIL ) 600 MG tablet TAKE 1 TABLET BY MOUTH EVERY 8 HOURS AS NEEDED   levothyroxine  (SYNTHROID ) 88 MCG tablet TAKE 1 TABLET BY MOUTH ONCE DAILY BEFORE BREAKFAST   lisinopril  (ZESTRIL ) 20 MG tablet TAKE 1/2 TO 1 (ONE-HALF TO ONE) TABLET BY MOUTH IN THE MORNING   meloxicam (MOBIC) 7.5 MG tablet Take 7.5 mg by mouth daily.   triamcinolone  cream (KENALOG ) 0.1 % Apply to affected area twice a day as needed for no more than 2 weeks at a time   cyclobenzaprine  (FLEXERIL ) 5 MG tablet Take 1 tablet (5 mg total) by mouth 2 (two) times daily as needed for muscle spasms. (Patient not taking: Reported on 12/12/2023)   No current facility-administered medications on file prior to visit.    Allergies:  Allergies  Allergen Reactions   Darvon [Propoxyphene Hcl]  Hives    Social History:  Social History   Socioeconomic History   Marital status: Widowed    Spouse name: Not on file   Number of children: Not on file   Years of education: Not on file   Highest education level: Not on file  Occupational History   Not on file  Tobacco Use   Smoking status: Every Day    Current packs/day: 0.75    Average packs/day: 0.8 packs/day for 20.0 years (15.0 ttl pk-yrs)    Types: Cigarettes   Smokeless tobacco: Never  Vaping Use   Vaping status: Never Used  Substance and Sexual Activity   Alcohol use: Yes    Alcohol/week: 12.0 standard drinks of alcohol    Types: 12 Cans of beer per week   Drug use: Never   Sexual activity: Yes    Birth control/protection: Post-menopausal  Other Topics Concern   Not on file  Social History Narrative   Not on file   Social Drivers of Health   Financial Resource Strain: Not on file  Food Insecurity: Not on file  Transportation Needs: Not on file  Physical  Activity: Not on file  Stress: Not on file  Social Connections: Unknown (11/15/2021)   Received from [facility], [facility]   Social Network    Social Network: Not on file  Intimate Partner Violence: Unknown (10/07/2021)   Received from [facility], [facility]   HITS    Physically Hurt: Not on file    Insult or Talk Down To: Not on file    Threaten Physical Harm: Not on file    Scream or Curse: Not on file   Social History   Tobacco Use  Smoking Status Every Day   Current packs/day: 0.75   Average packs/day: 0.8 packs/day for 20.0 years (15.0 ttl pk-yrs)   Types: Cigarettes  Smokeless Tobacco Never   Social History   Substance and Sexual Activity  Alcohol Use Yes   Alcohol/week: 12.0 standard drinks of alcohol   Types: 12 Cans of beer per week    Family History:  Family History  Problem Relation Age of Onset   Hypertension Mother    Healthy Father    Healthy Brother     Past medical history, surgical history, medications, allergies, family history and social history reviewed with patient today and changes made to appropriate areas of the chart.   Review of Systems  Constitutional: Negative.   HENT: Negative.    Eyes: Negative.   Respiratory: Negative.    Cardiovascular: Negative.   Gastrointestinal: Negative.   Genitourinary: Negative.   Musculoskeletal:  Positive for back pain.  Skin: Negative.   Neurological: Negative.   Psychiatric/Behavioral: Negative.     All other ROS negative except what is listed above and in the HPI.      Objective:     BP (!) 150/80 (BP Location: Left Arm, Cuff Size: Normal)   Pulse (!) 59   Temp (!) 97.4 F (36.3 C)   Ht 5' (1.524 m)   Wt 147 lb 9.6 oz (67 kg)   SpO2 97%   BMI 28.83 kg/m   Wt Readings from Last 3 Encounters:  12/12/23 147 lb 9.6 oz (67 kg)  11/03/22 146 lb 3.2 oz (66.3 kg)  05/05/22 138 lb (62.6 kg)    Physical Exam Vitals and nursing note reviewed.  Constitutional:      General: She is  not in acute distress.    Appearance: Normal appearance.  HENT:     Head: Normocephalic and atraumatic.     Right Ear: Tympanic membrane, ear canal and external ear normal.     Left Ear: Tympanic membrane, ear canal and external ear normal.     Mouth/Throat:     Mouth: Mucous membranes are moist.     Pharynx: No posterior oropharyngeal erythema.  Eyes:     Conjunctiva/sclera: Conjunctivae normal.  Cardiovascular:     Rate and Rhythm: Normal rate and regular rhythm.     Pulses: Normal pulses.     Heart sounds: Normal heart sounds.  Pulmonary:     Effort: Pulmonary effort is normal.     Breath sounds: Normal breath sounds.  Abdominal:     Palpations: Abdomen is soft.     Tenderness: There is no abdominal tenderness.  Musculoskeletal:        General: Normal range of motion.     Cervical back: Normal range of motion and neck supple.     Right lower leg: No edema.     Left lower leg: No edema.  Lymphadenopathy:     Cervical: No cervical adenopathy.  Skin:    General: Skin is warm and dry.  Neurological:     General: No focal deficit present.     Mental Status: She is alert and oriented to person, place, and time.     Cranial Nerves: No cranial nerve deficit.     Coordination: Coordination normal.     Gait: Gait normal.  Psychiatric:        Mood and Affect: Mood normal.        Behavior: Behavior normal.        Thought Content: Thought content normal.        Judgment: Judgment normal.     Results for orders placed or performed in visit on 11/03/22  CBC   Collection Time: 11/03/22  4:41 PM  Result Value Ref Range   WBC 6.2 4.0 - 10.5 K/uL   RBC 4.41 3.87 - 5.11 Mil/uL   Platelets 252.0 150.0 - 400.0 K/uL   Hemoglobin 15.3 (H) 12.0 - 15.0 g/dL   HCT 53.6 64.4 - 03.4 %   MCV 101.9 (H) 78.0 - 100.0 fl   MCHC 34.0 30.0 - 36.0 g/dL   RDW 74.2 59.5 - 63.8 %  Comprehensive metabolic panel   Collection Time: 11/03/22  4:41 PM  Result Value Ref Range   Sodium 132 (L) 135 -  145 mEq/L   Potassium 3.4 (L) 3.5 - 5.1 mEq/L   Chloride 95 (L) 96 - 112 mEq/L   CO2 29 19 - 32 mEq/L   Glucose, Bld 104 (H) 70 - 99 mg/dL   BUN 18 6 - 23 mg/dL   Creatinine, Ser 7.56 0.40 - 1.20 mg/dL   Total Bilirubin 0.5 0.2 - 1.2 mg/dL   Alkaline Phosphatase 54 39 - 117 U/L   AST 19 0 - 37 U/L   ALT 14 0 - 35 U/L   Total Protein 6.9 6.0 - 8.3 g/dL   Albumin 4.5 3.5 - 5.2 g/dL   GFR 43.32 >95.18 mL/min   Calcium 10.0 8.4 - 10.5 mg/dL  Lipid panel   Collection Time: 11/03/22  4:41 PM  Result Value Ref Range   Cholesterol 211 (H) 0 - 200 mg/dL   Triglycerides 84.1 0.0 - 149.0 mg/dL   HDL 660.63 >01.60 mg/dL   VLDL 8.4 0.0 - 10.9 mg/dL   LDL Cholesterol 323 (H) 0 - 99 mg/dL  Total CHOL/HDL Ratio 2    NonHDL 109.23   TSH   Collection Time: 11/03/22  4:41 PM  Result Value Ref Range   TSH 0.64 0.35 - 5.50 uIU/mL  Vitamin B12   Collection Time: 11/03/22  4:41 PM  Result Value Ref Range   Vitamin B-12 222 211 - 911 pg/mL      Assessment & Plan:   Problem List Items Addressed This Visit       Cardiovascular and Mediastinum   Benign essential hypertension   Chronic, not controlled. BP elevated 150/80 today. Increase lisinopril  to 40mg  daily and continue hydrochlorothiazide  25mg  daily. Check CMP, CBC, lipid panel today.       Relevant Orders   CBC with Differential/Platelet   Comprehensive metabolic panel with GFR   Lipid panel     Respiratory   Obstructive sleep apnea (adult) (pediatric)   Chronic, stable.  Continue CPAP at night        Endocrine   Hypothyroidism   Chronic, stable.  Continue levothyroxine  88 mcg daily.  Check TSH today.      Relevant Orders   TSH     Nervous and Auditory   Chronic left-sided low back pain with left-sided sciatica   Chronic, stable. Continue tens machine, dry needling and stretches.       Relevant Medications   meloxicam (MOBIC) 7.5 MG tablet     Other   Generalized anxiety disorder   Chronic, stable. Continue buspar  7.5mg  BID.       Tobacco use   Currently smoking about three quarters a pack a day.  Encourage complete tobacco cessation. Declines pneumonia vaccine today.       Routine general medical examination at a health care facility - Primary   Health maintenance reviewed and updated. Discussed nutrition, exercise.  Follow-up 1 year.        B12 deficiency   Check B12 levels today and treat based on results.       Relevant Orders   Vitamin B12   Other Visit Diagnoses       Screening for HIV (human immunodeficiency virus)       Screen HIV today   Relevant Orders   HIV Antibody (routine testing w rflx)     Encounter for hepatitis C screening test for low risk patient       Screen hepatitis C today   Relevant Orders   Hepatitis C antibody     Immunization due       Prevnar 20 given today   Relevant Orders   Pneumococcal conjugate vaccine 20-valent (Completed)        Follow up plan: Return in about 4 weeks (around 01/09/2024) for HTN.   LABORATORY TESTING:  - Pap smear: up to date  IMMUNIZATIONS:   - Tdap: Tetanus vaccination status reviewed: last tetanus booster within 10 years. - Influenza: Postponed to flu season - Pneumovax: Not applicable - Prevnar: Administered today - HPV: Not applicable - Shingrix  vaccine: Up to date  SCREENING: -Mammogram: Up to date  - Colonoscopy: Up to date  - Bone Density: Up to date   PATIENT COUNSELING:   Advised to take 1 mg of folate supplement per day if capable of pregnancy.   Sexuality: Discussed sexually transmitted diseases, partner selection, use of condoms, avoidance of unintended pregnancy  and contraceptive alternatives.   Advised to avoid cigarette smoking.  I discussed with the patient that most people either abstain from alcohol or drink within safe limits (<=14/week and <=  4 drinks/occasion for males, <=7/weeks and <= 3 drinks/occasion for females) and that the risk for alcohol disorders and other health effects rises  proportionally with the number of drinks per week and how often a drinker exceeds daily limits.  Discussed cessation/primary prevention of drug use and availability of treatment for abuse.   Diet: Encouraged to adjust caloric intake to maintain  or achieve ideal body weight, to reduce intake of dietary saturated fat and total fat, to limit sodium intake by avoiding high sodium foods and not adding table salt, and to maintain adequate dietary potassium and calcium preferably from fresh fruits, vegetables, and low-fat dairy products.    stressed the importance of regular exercise  Injury prevention: Discussed safety belts, safety helmets, smoke detector, smoking near bedding or upholstery.   Dental health: Discussed importance of regular tooth brushing, flossing, and dental visits.    NEXT PREVENTATIVE PHYSICAL DUE IN 1 YEAR. Return in about 4 weeks (around 01/09/2024) for HTN.  Angeles Paolucci A Farmer Mccahill

## 2023-12-12 NOTE — Assessment & Plan Note (Signed)
Check B12 levels today and treat based on results.

## 2023-12-12 NOTE — Patient Instructions (Signed)
 It was great to see you!  We are checking your labs today and will let you know the results via mychart/phone.   Increase your lisinopril  to 40mg  daily  Keep taking the hydrochlorothiazide    Let's follow-up in 4 weeks, sooner if you have concerns.  If a referral was placed today, you will be contacted for an appointment. Please note that routine referrals can sometimes take up to 3-4 weeks to process. Please call our office if you haven't heard anything after this time frame.  Take care,  Rheba Cedar, NP

## 2023-12-12 NOTE — Assessment & Plan Note (Signed)
Currently smoking about three quarters a pack a day.  Encourage complete tobacco cessation. Declines pneumonia vaccine today.

## 2023-12-12 NOTE — Assessment & Plan Note (Signed)
 Chronic, stable. Continue tens machine, dry needling and stretches.

## 2023-12-12 NOTE — Assessment & Plan Note (Signed)
Health maintenance reviewed and updated. Discussed nutrition, exercise. Follow-up 1 year.

## 2023-12-13 ENCOUNTER — Ambulatory Visit: Payer: Self-pay | Admitting: Nurse Practitioner

## 2023-12-13 LAB — CBC WITH DIFFERENTIAL/PLATELET
Absolute Lymphocytes: 2285 {cells}/uL (ref 850–3900)
Absolute Monocytes: 554 {cells}/uL (ref 200–950)
Basophils Absolute: 47 {cells}/uL (ref 0–200)
Basophils Relative: 0.6 %
Eosinophils Absolute: 218 {cells}/uL (ref 15–500)
Eosinophils Relative: 2.8 %
HCT: 40.7 % (ref 35.0–45.0)
Hemoglobin: 13.7 g/dL (ref 11.7–15.5)
MCH: 35.7 pg — ABNORMAL HIGH (ref 27.0–33.0)
MCHC: 33.7 g/dL (ref 32.0–36.0)
MCV: 106 fL — ABNORMAL HIGH (ref 80.0–100.0)
MPV: 11 fL (ref 7.5–12.5)
Monocytes Relative: 7.1 %
Neutro Abs: 4696 {cells}/uL (ref 1500–7800)
Neutrophils Relative %: 60.2 %
Platelets: 241 10*3/uL (ref 140–400)
RBC: 3.84 10*6/uL (ref 3.80–5.10)
RDW: 12.3 % (ref 11.0–15.0)
Total Lymphocyte: 29.3 %
WBC: 7.8 10*3/uL (ref 3.8–10.8)

## 2023-12-13 LAB — COMPREHENSIVE METABOLIC PANEL WITH GFR
AG Ratio: 2 (calc) (ref 1.0–2.5)
ALT: 16 U/L (ref 6–29)
AST: 18 U/L (ref 10–35)
Albumin: 4.5 g/dL (ref 3.6–5.1)
Alkaline phosphatase (APISO): 48 U/L (ref 37–153)
BUN: 13 mg/dL (ref 7–25)
CO2: 25 mmol/L (ref 20–32)
Calcium: 9.6 mg/dL (ref 8.6–10.4)
Chloride: 107 mmol/L (ref 98–110)
Creat: 0.73 mg/dL (ref 0.50–1.05)
Globulin: 2.2 g/dL (ref 1.9–3.7)
Glucose, Bld: 74 mg/dL (ref 65–99)
Potassium: 3.9 mmol/L (ref 3.5–5.3)
Sodium: 140 mmol/L (ref 135–146)
Total Bilirubin: 0.5 mg/dL (ref 0.2–1.2)
Total Protein: 6.7 g/dL (ref 6.1–8.1)
eGFR: 94 mL/min/{1.73_m2} (ref >=60)

## 2023-12-13 LAB — VITAMIN B12: Vitamin B-12: 384 pg/mL (ref 200–1100)

## 2023-12-13 LAB — TSH: TSH: 0.83 m[IU]/L (ref 0.40–4.50)

## 2023-12-13 LAB — LIPID PANEL
Cholesterol: 209 mg/dL — ABNORMAL HIGH (ref <200)
HDL: 104 mg/dL (ref >=50)
LDL Cholesterol (Calc): 84 mg/dL
Non-HDL Cholesterol (Calc): 105 mg/dL (ref <130)
Total CHOL/HDL Ratio: 2 (calc) (ref <5.0)
Triglycerides: 111 mg/dL (ref <150)

## 2023-12-13 LAB — HIV ANTIBODY (ROUTINE TESTING W REFLEX): HIV 1&2 Ab, 4th Generation: NONREACTIVE

## 2023-12-13 LAB — HEPATITIS C ANTIBODY: Hepatitis C Ab: NONREACTIVE

## 2023-12-13 NOTE — Telephone Encounter (Signed)
 Requesting: Lisinopril  20 MG Oral Tablet  Last Visit: 12/12/2023 Next Visit: No follow up visit Last Refill: 11/08/2023  Please Advise

## 2024-02-10 ENCOUNTER — Other Ambulatory Visit: Payer: Self-pay | Admitting: Nurse Practitioner

## 2024-02-10 NOTE — Telephone Encounter (Signed)
 Requesting: Lisinopril  20 MG Oral Tablet  Last Visit: 12/12/2023 Next Visit: Visit date not found Last Refill: 12/13/2023 was due for BP recheck on 01/09/24  Please Advise

## 2024-03-02 ENCOUNTER — Other Ambulatory Visit: Payer: Self-pay | Admitting: Nurse Practitioner

## 2024-03-11 ENCOUNTER — Other Ambulatory Visit: Payer: Self-pay | Admitting: Nurse Practitioner

## 2024-03-21 ENCOUNTER — Encounter: Payer: Self-pay | Admitting: Nurse Practitioner

## 2024-03-21 MED ORDER — LISINOPRIL 20 MG PO TABS: 20.0000 mg | ORAL_TABLET | Freq: Every day | ORAL | 0 refills | Status: DC

## 2024-03-21 NOTE — Telephone Encounter (Signed)
 Requesting: Lisinopril   Last Visit: 12/12/2023 Next Visit: 04/03/2024 Last Refill: 02/10/2024  Please Advise

## 2024-03-27 ENCOUNTER — Other Ambulatory Visit: Payer: Self-pay | Admitting: Nurse Practitioner

## 2024-03-27 NOTE — Telephone Encounter (Signed)
 Requesting: hydroCHLOROthiazide  25 MG Oral Tablet  Last Visit: 12/12/2023 Next Visit: 04/03/2024 Last Refill: 12/07/2023  Please Advise

## 2024-04-03 ENCOUNTER — Ambulatory Visit: Admitting: Nurse Practitioner

## 2024-04-03 ENCOUNTER — Encounter: Payer: Self-pay | Admitting: Nurse Practitioner

## 2024-04-03 VITALS — BP 122/68 | HR 62 | Temp 96.9°F | Ht 60.0 in | Wt 150.4 lb

## 2024-04-03 DIAGNOSIS — I10 Essential (primary) hypertension: Secondary | ICD-10-CM | POA: Diagnosis not present

## 2024-04-03 DIAGNOSIS — B351 Tinea unguium: Secondary | ICD-10-CM | POA: Diagnosis not present

## 2024-04-03 LAB — BASIC METABOLIC PANEL WITH GFR
BUN: 12 mg/dL (ref 6–23)
CO2: 33 meq/L — ABNORMAL HIGH (ref 19–32)
Calcium: 10.2 mg/dL (ref 8.4–10.5)
Chloride: 95 meq/L — ABNORMAL LOW (ref 96–112)
Creatinine, Ser: 0.75 mg/dL (ref 0.40–1.20)
GFR: 86.37 mL/min (ref >60.00)
Glucose, Bld: 80 mg/dL (ref 70–99)
Potassium: 4.1 meq/L (ref 3.5–5.1)
Sodium: 137 meq/L (ref 135–145)

## 2024-04-03 MED ORDER — TERBINAFINE HCL 250 MG PO TABS: 250.0000 mg | ORAL_TABLET | Freq: Every day | ORAL | 0 refills | Status: AC

## 2024-04-03 NOTE — Patient Instructions (Signed)
 It was great to see you!  Start terbinafine once a day for 12 weeks to help with your toenails.   Keep taking your blood pressure medicine  Let's follow-up in 9 months, sooner if you have concerns.  If a referral was placed today, you will be contacted for an appointment. Please note that routine referrals can sometimes take up to 3-4 weeks to process. Please call our office if you haven't heard anything after this time frame.  Take care,  Tinnie Harada, NP

## 2024-04-03 NOTE — Progress Notes (Unsigned)
   Established Patient Office Visit  Subjective   Patient ID: Carol Ho, female    DOB: 1966/02/28  Age: 58 y.o. MRN: [id number]  Chief Complaint  Patient presents with   Hypertension    Follow up, toenail fungus on great toes    HPI  {History (Optional):23778}  ROS    Objective:     BP 122/68 (BP Location: Left Arm, Patient Position: Sitting, Cuff Size: Normal)   Pulse 62   Temp (!) 96.9 F (36.1 C)   Ht 5' (1.524 m)   Wt 150 lb 6.4 oz (68.2 kg)   SpO2 98%   BMI 29.37 kg/m  {Vitals History (Optional):23777}  Physical Exam   No results found for any visits on 04/03/24.  {Labs (Optional):23779}  The ASCVD Risk score (Arnett DK, et al., 2019) failed to calculate for the following reasons:   The valid HDL cholesterol range is 20 to 100 mg/dL    Assessment & Plan:   Problem List Items Addressed This Visit   None   No follow-ups on file.    Tinnie DELENA Harada, NP

## 2024-04-04 ENCOUNTER — Ambulatory Visit: Payer: Self-pay | Admitting: Nurse Practitioner

## 2024-04-04 DIAGNOSIS — B351 Tinea unguium: Secondary | ICD-10-CM | POA: Insufficient documentation

## 2024-04-04 NOTE — Assessment & Plan Note (Signed)
 Chronic, stable. Hypertension is well-controlled with stable blood pressure readings and no reported symptoms. Continue lisinopril  20mg  daily and hctz 25mg  daily. [place]k BMP today.

## 2024-04-04 NOTE — Assessment & Plan Note (Signed)
 Onychomycosis is present without prior treatment. She opted for oral terbinafine after discussing treatment options. Start terbinafine 250mg  once daily for twelve weeks. Treatment duration is 12 weeks for medication, with nail regrowth taking up to 18 months.

## 2024-04-17 ENCOUNTER — Other Ambulatory Visit: Payer: Self-pay | Admitting: Nurse Practitioner

## 2024-04-19 NOTE — Telephone Encounter (Signed)
 Requesting:  Lisinopril  20 MG Oral Tablet  Last Visit: 04/03/2024 Next Visit: 12/12/2024 Last Refill: 03/21/2024  Please Advise

## 2024-05-22 ENCOUNTER — Other Ambulatory Visit: Payer: Self-pay | Admitting: Nurse Practitioner

## 2024-05-22 NOTE — Telephone Encounter (Signed)
 Requesting: Levothyroxine  Sodium 88 MCG Oral Tablet  Last Visit: 04/03/2024 Next Visit: 12/12/2024 Last Refill: 11/29/2023  Please Advise

## 2024-06-23 ENCOUNTER — Other Ambulatory Visit: Payer: Self-pay | Admitting: Nurse Practitioner

## 2024-06-25 NOTE — Telephone Encounter (Signed)
 Requesting: hydroCHLOROthiazide  25 MG Oral Tablet  Last Visit: 04/03/2024 Next Visit: 12/12/2024 Last Refill: 03/27/2024  Please Advise

## 2024-07-23 ENCOUNTER — Other Ambulatory Visit: Payer: Self-pay | Admitting: Dermatology

## 2024-12-12 ENCOUNTER — Encounter: Admitting: Nurse Practitioner
